# Patient Record
Sex: Female | Born: 1970 | Race: Black or African American | Hispanic: No | Marital: Single | Smoking: Never smoker
Health system: Southern US, Community
[De-identification: ages and names within clinical notes are randomized; demographics above are authoritative.]

## PROBLEM LIST (undated history)

## (undated) DIAGNOSIS — J45909 Unspecified asthma, uncomplicated: Secondary | ICD-10-CM

## (undated) HISTORY — PX: HERNIA REPAIR: SHX51

## (undated) HISTORY — DX: Unspecified asthma, uncomplicated: J45.909

---

## 2017-06-19 ENCOUNTER — Ambulatory Visit
Admission: RE | Admit: 2017-06-19 | Discharge: 2017-06-19 | Disposition: A | Discharge status: Home / Self Care | Payer: No Typology Code available for payment source | Source: Ambulatory Visit | Attending: Internal Medicine | Admitting: Internal Medicine

## 2017-06-19 ENCOUNTER — Other Ambulatory Visit: Payer: Self-pay | Admitting: Internal Medicine

## 2017-06-19 DIAGNOSIS — Z111 Encounter for screening for respiratory tuberculosis: Secondary | ICD-10-CM

## 2018-09-28 ENCOUNTER — Encounter: Payer: Self-pay | Admitting: *Deleted

## 2018-09-28 NOTE — Congregational Nurse Program (Signed)
Patient is from [place] and states she and daughter need counseling due to her ex husband's family exerting emotional  pressure on her to have her daughter return to [place] to have female circumcision performed.   Patient does not have Medicaid or orange card. Consulted with Lattie Hawharlotte Evans, CN who advised CN [facility] @ [phone] to arrange counseling there.  CN [facility] with [facility] and gave her contact information for patient @ [phone].  Thea SilversmithMackenzie stated she will call patient and complete screening questionaire by phone with patient.  CN advised patient that [facility] will call her.  Patient verbalized understanding and stated she felt comfortable speaking with [facility] over the phone. Also advised patient to return to [facility] on 09/1918 @ 2:00 pm in order to enroll for the [employer] with assistance from [place] with [employer].  Patient verbalized understanding.

## 2018-09-29 ENCOUNTER — Encounter: Payer: Self-pay | Admitting: *Deleted

## 2018-09-29 NOTE — Congregational Nurse Program (Signed)
CN first saw patient on 09/28/18.  Patient was  seeking help for her daughter to have a mental health evaluation performed by a psychologist.   Patient stated that her ex-husband's family is pressuring her to bring her 47 year old daughter to [place] for female circumcision.  CN spoke with [facility], nonprofit counseling service to seek services for patient.  [facility] stated they would call patient for intake questionaire.  Patient returned to nurse clinic today (09/29/18) and stated [facility] had not contacted her.  CN called [facility] while patient was in clinic.  After patient's intake, [facility] ascertained that patient's request for psychological evaluation could not be met at [facility].  Dr. Luiz Ochoa stated that [facility] could provide a Comprehensive Clinical Assessment which would be appropriate for the child. Patient rejects suggestions and states her immigration attorney states child needs psychological evaluation.  Patient will follow up with her attorney with information she has obtained. Brett Canales, BSN, RN Congregational Nurse [phone]

## 2018-11-19 LAB — GLUCOSE, POCT (MANUAL RESULT ENTRY): POC Glucose: 106 mg/dl — AB (ref 70–99)

## 2018-12-25 ENCOUNTER — Ambulatory Visit: Payer: Self-pay | Admitting: Internal Medicine

## 2019-01-05 DIAGNOSIS — R7309 Other abnormal glucose: Secondary | ICD-10-CM

## 2019-01-05 LAB — GLUCOSE, POCT (MANUAL RESULT ENTRY): POC Glucose: 65 mg/dl — AB (ref 70–99)

## 2019-01-05 NOTE — Congregational Nurse Program (Signed)
Ms Void came in for blood sugar and blood pressure check. She has been going through tough times with immigration and her stress level are high. She is seeing a mental health professional and has orange card. She will be seeing her PCP soon. Her blood pressure was low to 65. She has not had anything to eat since last night although she has no food insecurities at this time.I have given her soda to drink.I have encouraged her to eat small frequent meals.She will be back next week for blood sugar check. Arman Bogus RN BSN PCCN [phone]

## 2020-01-31 DIAGNOSIS — Z139 Encounter for screening, unspecified: Secondary | ICD-10-CM

## 2020-01-31 NOTE — Congregational Nurse Program (Addendum)
  Dept: [phone]   Congregational Nurse Program Note  Date of Encounter: 01/31/2020  Past Medical History: No past medical history on file.  Encounter Details:  Client came into clinic to get blood pressure and blood sugar. Her BP was 123/78 and blood sugar 101 not fasting. During conversation she was interested in dental care for her family. I gave her [facility] information and made a call to get her on a waiting list for cleaning/x-rays. She also wanted information on food mobile markets when I asked her about fresh food. I gave her 4 different afternoon mobile market dates coming up in the next 2 months. 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 I placed a call to [facility] [phone] to sign her up for a vaccine 02/03/20 at 4[place]45pm [place]. Client expressed appreciation for the information. Orion Crook, RN, CNP [phone]

## 2020-02-01 LAB — GLUCOSE, POCT (MANUAL RESULT ENTRY): POC Glucose: 101 mg/dl — AB (ref 70–99)

## 2020-02-03 ENCOUNTER — Ambulatory Visit: Payer: Self-pay | Attending: Internal Medicine

## 2020-02-03 DIAGNOSIS — Z23 Encounter for immunization: Secondary | ICD-10-CM

## 2020-02-03 NOTE — Progress Notes (Signed)
   Covid-19 Vaccination Clinic  Name:  Sherry Edwards    MRN: [id number] DOB: Aug 28, 1971  02/03/2020  [name] was observed post Covid-19 immunization for 15 minutes without incident. She was provided with Vaccine Information Sheet and instruction to access the V-Safe system.   [name] was instructed to call 911 with any severe reactions post vaccine: Marland Kitchen Difficulty breathing  . Swelling of face and throat  . A fast heartbeat  . A bad rash all over body  . Dizziness and weakness   Immunizations Administered    Name Date Dose VIS Date Route   Pfizer COVID-19 Vaccine 02/03/2020  5:04 PM 0.3 mL 10/22/2019 Intramuscular   Manufacturer: [employer], [employer]   Lot: [id number]   NDC: [address]-5

## 2020-02-28 ENCOUNTER — Ambulatory Visit: Payer: Self-pay | Attending: Internal Medicine

## 2020-02-28 DIAGNOSIS — Z23 Encounter for immunization: Secondary | ICD-10-CM

## 2020-02-28 NOTE — Progress Notes (Signed)
   Covid-19 Vaccination Clinic  Name:  Sherry Edwards    MRN: [id number] DOB: 08-07-1971  02/28/2020  [name] was observed post Covid-19 immunization for 15 minutes without incident. She was provided with Vaccine Information Sheet and instruction to access the V-Safe system.   [name] was instructed to call 911 with any severe reactions post vaccine: Marland Kitchen Difficulty breathing  . Swelling of face and throat  . A fast heartbeat  . A bad rash all over body  . Dizziness and weakness   Immunizations Administered    Name Date Dose VIS Date Route   Pfizer COVID-19 Vaccine 02/28/2020  3:08 PM 0.3 mL 01/05/2019 Intramuscular   Manufacturer: [employer], [employer]   Lot: [id number]   NDC: [address]-9

## 2020-05-17 ENCOUNTER — Telehealth: Payer: Self-pay

## 2020-05-17 NOTE — Telephone Encounter (Signed)
Client requesting information for help with orange card. CNP wil work on getting appointment to help client with this application. Client also needs help getting dental extraction and surgery for her son. CNP will research this for the client. Orion Crook, RN, BSN, CNP [phone]

## 2020-05-24 ENCOUNTER — Other Ambulatory Visit: Payer: Self-pay

## 2020-05-24 ENCOUNTER — Telehealth: Payer: Self-pay

## 2020-05-24 ENCOUNTER — Ambulatory Visit: Payer: Self-pay

## 2020-05-24 NOTE — Telephone Encounter (Signed)
I spoke with client to set appointment with Tia Masker with [facility] to assist her with [facility] Card application/renewal and finding dental care for her son. Appointment set for next Tuesday at 2pm at [facility]. Orion Crook, RN, BSN, CNP [phone]

## 2020-05-25 DIAGNOSIS — Z139 Encounter for screening, unspecified: Secondary | ICD-10-CM

## 2020-05-25 LAB — GLUCOSE, POCT (MANUAL RESULT ENTRY): POC Glucose: 89 mg/dl (ref 70–99)

## 2020-05-25 NOTE — Congregational Nurse Program (Signed)
  Dept: [phone]   Congregational Nurse Program Note  Date of Encounter: 05/25/2020  Past Medical History: No past medical history on file.  Encounter Details:  CNP Questionnaire - 05/25/20 1117      Questionnaire   Patient Status Immigrant    Race African    Location Patient Served At [employer] Not Applicable    Uninsured Uninsured (NEW 1x/quarter)    Food Yes, have food insecurities    Housing/Utilities Yes, have permanent housing    Transportation No transportation needs    Interpersonal Safety Yes, feel physically and emotionally safe where you currently live    Medication No medication insecurities    Medical Provider No    Referrals Primary Care Provider/Clinic;Orange Card/Care Connects    ED Visit Averted Yes    Life-Saving Intervention Made Not Applicable          Client came to client for screening for BP and blood sugar. She does not have a PCP. BP 109/71 and pulse 61. Blood sugar 89 fasting. She complains of Left leg swelling and pain. Upon assessment bot legs appear swollen but client only complains of pain in left leg starting at shin and running down front of leg to foot. She works as a Lawyer. She is seeing a Hotel manager, Raoufou Ousmane with [facility] on Tuesday at 2pm to work on [employer]. CNP called [facility] to ask for an appointment for this client. CNP gave client information on a monthly mobile food market and will supply her with more information next week on other places and times to get fresh food in the area from [employer]. Previously CNP had referred her to [facility] for dental cleaning and services. She was grateful for this referral and has an appointment for her son to see a surgeon for a tooth extraction and surgery. Orion Crook, RN, BSN, CNP [phone]

## 2020-05-30 ENCOUNTER — Telehealth: Payer: Self-pay

## 2020-05-30 NOTE — Telephone Encounter (Signed)
Spoke to client after speaking with Robyne Peers at [facility]. Laural Benes. set up appointment at [facility] to establish care. Communicated with client appointment set for June 05, 2020 at 3:30pm at [facility] [address]  [place], [place].Client will come by [facility] on Tuesday July 20, 2021at 1pm to get referral and directions to appointment. Orion Crook, RN, BSN, CNP [phone]

## 2020-06-05 ENCOUNTER — Encounter (INDEPENDENT_AMBULATORY_CARE_PROVIDER_SITE_OTHER): Payer: Self-pay | Admitting: Primary Care

## 2020-06-05 ENCOUNTER — Ambulatory Visit (INDEPENDENT_AMBULATORY_CARE_PROVIDER_SITE_OTHER): Payer: Self-pay | Admitting: Primary Care

## 2020-06-05 ENCOUNTER — Other Ambulatory Visit: Payer: Self-pay

## 2020-06-05 VITALS — BP 109/66 | HR 81 | Temp 97.6°F | Resp 16 | Ht 60.0 in | Wt 201.0 lb

## 2020-06-05 DIAGNOSIS — R600 Localized edema: Secondary | ICD-10-CM

## 2020-06-05 DIAGNOSIS — Z7689 Persons encountering health services in other specified circumstances: Secondary | ICD-10-CM

## 2020-06-05 DIAGNOSIS — Z6839 Body mass index (BMI) 39.0-39.9, adult: Secondary | ICD-10-CM

## 2020-06-05 DIAGNOSIS — Z23 Encounter for immunization: Secondary | ICD-10-CM

## 2020-06-05 DIAGNOSIS — Z1159 Encounter for screening for other viral diseases: Secondary | ICD-10-CM

## 2020-06-05 DIAGNOSIS — Z114 Encounter for screening for human immunodeficiency virus [HIV]: Secondary | ICD-10-CM

## 2020-06-05 DIAGNOSIS — E6609 Other obesity due to excess calories: Secondary | ICD-10-CM

## 2020-06-05 DIAGNOSIS — M25572 Pain in left ankle and joints of left foot: Secondary | ICD-10-CM

## 2020-06-05 NOTE — Progress Notes (Signed)
Pt c/o bilateral ankle and feet swelling on ad off since last year

## 2020-06-05 NOTE — Progress Notes (Signed)
New Patient Office Visit  Subjective:  Patient ID: Sherry Edwards, female    DOB: 08-28-1971  Age: 49 y.o. MRN: [id number]  CC: No chief complaint on file.   HPI Sherry Edwards is a 49 year old female  presents for establishment of care . She voices bilateral ankle and feet swelling off and on for over the last year   No past medical history on file.  No past surgical history on file.  No family history on file.  Social History   Socioeconomic History  . Marital status: Married    Spouse name: Not on file  . Number of children: Not on file  . Years of education: Not on file  . Highest education level: Not on file  Occupational History  . Not on file  Tobacco Use  . Smoking status: Never Smoker  . Smokeless tobacco: Never Used  Substance and Sexual Activity  . Alcohol use: Not on file  . Drug use: Not on file  . Sexual activity: Not on file  Other Topics Concern  . Not on file  Social History Narrative  . Not on file   Social Determinants of Health   Financial Resource Strain:   . Difficulty of Paying Living Expenses:   Food Insecurity:   . Worried About Charity fundraiser in the Last Year:   . Arboriculturist in the Last Year:   Transportation Needs:   . Film/video editor (Medical):   Marland Kitchen Lack of Transportation (Non-Medical):   Physical Activity:   . Days of Exercise per Week:   . Minutes of Exercise per Session:   Stress:   . Feeling of Stress :   Social Connections:   . Frequency of Communication with Friends and Family:   . Frequency of Social Gatherings with Friends and Family:   . Attends Religious Services:   . Active Member of Clubs or Organizations:   . Attends Archivist Meetings:   Marland Kitchen Marital Status:   Intimate Partner Violence:   . Fear of Current or Ex-Partner:   . Emotionally Abused:   Marland Kitchen Physically Abused:   . Sexually Abused:     ROS Review of Systems  Cardiovascular: Positive for leg swelling.  All other  systems reviewed and are negative.   Objective:   Today's Vitals: BP 109/66   Pulse 81   Temp 97.6 F (36.4 C)   Resp 16   Ht 5' (1.524 m)   Wt 201 lb (91.2 kg)   SpO2 99%   BMI 39.26 kg/m   Physical Exam Vitals reviewed.  Constitutional:      Appearance: She is obese.  HENT:     Head: Normocephalic.     Right Ear: Tympanic membrane normal.     Left Ear: Tympanic membrane normal.  Eyes:     Extraocular Movements: Extraocular movements intact.  Cardiovascular:     Rate and Rhythm: Normal rate and regular rhythm.     Pulses: Normal pulses.     Heart sounds: Normal heart sounds.  Pulmonary:     Effort: Pulmonary effort is normal.  Abdominal:     General: Bowel sounds are normal.  Musculoskeletal:        General: Swelling present. Normal range of motion.     Cervical back: Normal range of motion and neck supple.  Skin:    General: Skin is warm and dry.  Neurological:     Mental Status: She is alert and  oriented to person, place, and time.  Psychiatric:        Mood and Affect: Mood normal.        Behavior: Behavior normal.        Thought Content: Thought content normal.     Assessment & Plan:  Diagnoses and all orders for this visit:  Encounter to establish care Juluis Mire, NP-C will be your  (PCP) she is mastered prepared . Able to diagnosed and treatment also  answer health concern as well as continuing care of varied medical conditions, not limited by cause, organ system, or diagnosis.   Bilateral lower extremity edema Unable to diurese with initial visit blood pressure 109/66 recommend comfort compression hoses and elevation of feet when at home.  Discussed warning sodium in diet -     CBC with Differential -     CMP14+EGFR  Class 2 obesity due to excess calories without serious comorbidity with body mass index (BMI) of 39.0 to 39.9 in adult Obesity is 30-39 indicating an excess in caloric intake or underlining conditions. This may lead to other  co-morbidities. ie type 2 diabetes, hypertension and respiratory complications .lifestyle modifications of diet and exercise may reduce obesity.  -     Lipid Panel  Encounter for screening for HIV Healthcare maintenance and care gaps -     HIV Antibody (routine testing w rflx)  Encounter for hepatitis C screening test for low risk patient  Healthcare maintenance and care gaps -     Hepatitis C Antibody  Acute left ankle pain Denies any falls or potential twisting foot the wrong way.  Range of motion normal.  May use ice or heat for comfort  Need for Tdap vaccination -     Tdap vaccine greater than or equal to 7yo IM    Follow-up: Return if symptoms worsen or fail to improve.   Kerin Perna, NP

## 2020-06-05 NOTE — Patient Instructions (Signed)
DASH Eating Plan DASH stands for "Dietary Approaches to Stop Hypertension." The DASH eating plan is a healthy eating plan that has been shown to reduce high blood pressure (hypertension). It may also reduce your risk for type 2 diabetes, heart disease, and stroke. The DASH eating plan may also help with weight loss. What are tips for following this plan?  General guidelines  Avoid eating more than 2,300 mg (milligrams) of salt (sodium) a day. If you have hypertension, you may need to reduce your sodium intake to 1,500 mg a day.  Limit alcohol intake to no more than 1 drink a day for nonpregnant women and 2 drinks a day for men. One drink equals 12 oz of beer, 5 oz of wine, or 1 oz of hard liquor.  Work with your health care provider to maintain a healthy body weight or to lose weight. Ask what an ideal weight is for you.  Get at least 30 minutes of exercise that causes your heart to beat faster (aerobic exercise) most days of the week. Activities may include walking, swimming, or biking.  Work with your health care provider or diet and nutrition specialist (dietitian) to adjust your eating plan to your individual calorie needs. Reading food labels   Check food labels for the amount of sodium per serving. Choose foods with less than 5 percent of the Daily Value of sodium. Generally, foods with less than 300 mg of sodium per serving fit into this eating plan.  To find whole grains, look for the word "whole" as the first word in the ingredient list. Shopping  Buy products labeled as "low-sodium" or "no salt added."  Buy fresh foods. Avoid canned foods and premade or frozen meals. Cooking  Avoid adding salt when cooking. Use salt-free seasonings or herbs instead of table salt or sea salt. Check with your health care provider or pharmacist before using salt substitutes.  Do not fry foods. Cook foods using healthy methods such as baking, boiling, grilling, and broiling instead.  Cook with  heart-healthy oils, such as olive, canola, soybean, or sunflower oil. Meal planning  Eat a balanced diet that includes: ? 5 or more servings of fruits and vegetables each day. At each meal, try to fill half of your plate with fruits and vegetables. ? Up to 6-8 servings of whole grains each day. ? Less than 6 oz of lean meat, poultry, or fish each day. A 3-oz serving of meat is about the same size as a deck of cards. One egg equals 1 oz. ? 2 servings of low-fat dairy each day. ? A serving of nuts, seeds, or beans 5 times each week. ? Heart-healthy fats. Healthy fats called Omega-3 fatty acids are found in foods such as flaxseeds and coldwater fish, like sardines, salmon, and mackerel.  Limit how much you eat of the following: ? Canned or prepackaged foods. ? Food that is high in trans fat, such as fried foods. ? Food that is high in saturated fat, such as fatty meat. ? Sweets, desserts, sugary drinks, and other foods with added sugar. ? Full-fat dairy products.  Do not salt foods before eating.  Try to eat at least 2 vegetarian meals each week.  Eat more home-cooked food and less restaurant, buffet, and fast food.  When eating at a restaurant, ask that your food be prepared with less salt or no salt, if possible. What foods are recommended? The items listed may not be a complete list. Talk with your dietitian about   what dietary choices are best for you. Grains Whole-grain or whole-wheat bread. Whole-grain or whole-wheat pasta. Brown rice. Oatmeal. Quinoa. Bulgur. Whole-grain and low-sodium cereals. Pita bread. Low-fat, low-sodium crackers. Whole-wheat flour tortillas. Vegetables Fresh or frozen vegetables (raw, steamed, roasted, or grilled). Low-sodium or reduced-sodium tomato and vegetable juice. Low-sodium or reduced-sodium tomato sauce and tomato paste. Low-sodium or reduced-sodium canned vegetables. Fruits All fresh, dried, or frozen fruit. Canned fruit in natural juice (without  added sugar). Meat and other protein foods Skinless chicken or turkey. Ground chicken or turkey. Pork with fat trimmed off. Fish and seafood. Egg whites. Dried beans, peas, or lentils. Unsalted nuts, nut butters, and seeds. Unsalted canned beans. Lean cuts of beef with fat trimmed off. Low-sodium, lean deli meat. Dairy Low-fat (1%) or fat-free (skim) milk. Fat-free, low-fat, or reduced-fat cheeses. Nonfat, low-sodium ricotta or cottage cheese. Low-fat or nonfat yogurt. Low-fat, low-sodium cheese. Fats and oils Soft margarine without trans fats. Vegetable oil. Low-fat, reduced-fat, or light mayonnaise and salad dressings (reduced-sodium). Canola, safflower, olive, soybean, and sunflower oils. Avocado. Seasoning and other foods Herbs. Spices. Seasoning mixes without salt. Unsalted popcorn and pretzels. Fat-free sweets. What foods are not recommended? The items listed may not be a complete list. Talk with your dietitian about what dietary choices are best for you. Grains Baked goods made with fat, such as croissants, muffins, or some breads. Dry pasta or rice meal packs. Vegetables Creamed or fried vegetables. Vegetables in a cheese sauce. Regular canned vegetables (not low-sodium or reduced-sodium). Regular canned tomato sauce and paste (not low-sodium or reduced-sodium). Regular tomato and vegetable juice (not low-sodium or reduced-sodium). Pickles. Olives. Fruits Canned fruit in a light or heavy syrup. Fried fruit. Fruit in cream or butter sauce. Meat and other protein foods Fatty cuts of meat. Ribs. Fried meat. Bacon. Sausage. Bologna and other processed lunch meats. Salami. Fatback. Hotdogs. Bratwurst. Salted nuts and seeds. Canned beans with added salt. Canned or smoked fish. Whole eggs or egg yolks. Chicken or turkey with skin. Dairy Whole or 2% milk, cream, and half-and-half. Whole or full-fat cream cheese. Whole-fat or sweetened yogurt. Full-fat cheese. Nondairy creamers. Whipped toppings.  Processed cheese and cheese spreads. Fats and oils Butter. Stick margarine. Lard. Shortening. Ghee. Bacon fat. Tropical oils, such as coconut, palm kernel, or palm oil. Seasoning and other foods Salted popcorn and pretzels. Onion salt, garlic salt, seasoned salt, table salt, and sea salt. Worcestershire sauce. Tartar sauce. Barbecue sauce. Teriyaki sauce. Soy sauce, including reduced-sodium. Steak sauce. Canned and packaged gravies. Fish sauce. Oyster sauce. Cocktail sauce. Horseradish that you find on the shelf. Ketchup. Mustard. Meat flavorings and tenderizers. Bouillon cubes. Hot sauce and Tabasco sauce. Premade or packaged marinades. Premade or packaged taco seasonings. Relishes. Regular salad dressings. Where to find more information:  National Heart, Lung, and Blood Institute: www.nhlbi.nih.gov  American Heart Association: www.heart.org Summary  The DASH eating plan is a healthy eating plan that has been shown to reduce high blood pressure (hypertension). It may also reduce your risk for type 2 diabetes, heart disease, and stroke.  With the DASH eating plan, you should limit salt (sodium) intake to 2,300 mg a day. If you have hypertension, you may need to reduce your sodium intake to 1,500 mg a day.  When on the DASH eating plan, aim to eat more fresh fruits and vegetables, whole grains, lean proteins, low-fat dairy, and heart-healthy fats.  Work with your health care provider or diet and nutrition specialist (dietitian) to adjust your eating plan to your   individual calorie needs. This information is not intended to replace advice given to you by your health care provider. Make sure you discuss any questions you have with your health care provider. Document Revised: 10/10/2017 Document Reviewed: 10/21/2016 Elsevier Patient Education  2020 Elsevier Inc.  

## 2020-06-06 LAB — LIPID PANEL
Chol/HDL Ratio: 2.9 ratio (ref 0.0–4.4)
Cholesterol, Total: 190 mg/dL (ref 100–199)
HDL: 66 mg/dL (ref >39)
LDL Chol Calc (NIH): 112 mg/dL — ABNORMAL HIGH (ref 0–99)
Triglycerides: 63 mg/dL (ref 0–149)
VLDL Cholesterol Cal: 12 mg/dL (ref 5–40)

## 2020-06-06 LAB — CMP14+EGFR
ALT: 26 IU/L (ref 0–32)
AST: 20 IU/L (ref 0–40)
Albumin/Globulin Ratio: 1.5 (ref 1.2–2.2)
Albumin: 4.2 g/dL (ref 3.8–4.8)
Alkaline Phosphatase: 79 IU/L (ref 48–121)
BUN/Creatinine Ratio: 13 (ref 9–23)
BUN: 13 mg/dL (ref 6–24)
Bilirubin Total: 0.5 mg/dL (ref 0.0–1.2)
CO2: 24 mmol/L (ref 20–29)
Calcium: 8.9 mg/dL (ref 8.7–10.2)
Chloride: 104 mmol/L (ref 96–106)
Creatinine, Ser: 1 mg/dL (ref 0.57–1.00)
GFR calc Af Amer: 78 mL/min/{1.73_m2} (ref >59)
GFR calc non Af Amer: 67 mL/min/{1.73_m2} (ref >59)
Globulin, Total: 2.8 g/dL (ref 1.5–4.5)
Glucose: 100 mg/dL — ABNORMAL HIGH (ref 65–99)
Potassium: 3.8 mmol/L (ref 3.5–5.2)
Sodium: 141 mmol/L (ref 134–144)
Total Protein: 7 g/dL (ref 6.0–8.5)

## 2020-06-06 LAB — CBC WITH DIFFERENTIAL/PLATELET
Basophils Absolute: 0 10*3/uL (ref 0.0–0.2)
Basos: 1 %
EOS (ABSOLUTE): 0.2 10*3/uL (ref 0.0–0.4)
Eos: 4 %
Hematocrit: 42.9 % (ref 34.0–46.6)
Hemoglobin: 13.1 g/dL (ref 11.1–15.9)
Immature Grans (Abs): 0 10*3/uL (ref 0.0–0.1)
Immature Granulocytes: 1 %
Lymphocytes Absolute: 2.5 10*3/uL (ref 0.7–3.1)
Lymphs: 43 %
MCH: 22.4 pg — ABNORMAL LOW (ref 26.6–33.0)
MCHC: 30.5 g/dL — ABNORMAL LOW (ref 31.5–35.7)
MCV: 73 fL — ABNORMAL LOW (ref 79–97)
Monocytes Absolute: 0.4 10*3/uL (ref 0.1–0.9)
Monocytes: 7 %
Neutrophils Absolute: 2.6 10*3/uL (ref 1.4–7.0)
Neutrophils: 44 %
RBC: 5.85 x10E6/uL — ABNORMAL HIGH (ref 3.77–5.28)
RDW: 14.9 % (ref 11.7–15.4)
WBC: 5.7 10*3/uL (ref 3.4–10.8)

## 2020-06-06 LAB — HEPATITIS C ANTIBODY: Hep C Virus Ab: <0.1 s/co ratio (ref 0.0–0.9)

## 2020-06-06 LAB — HIV ANTIBODY (ROUTINE TESTING W REFLEX): HIV Screen 4th Generation wRfx: NONREACTIVE

## 2020-06-13 DIAGNOSIS — R600 Localized edema: Secondary | ICD-10-CM | POA: Insufficient documentation

## 2020-06-13 DIAGNOSIS — E669 Obesity, unspecified: Secondary | ICD-10-CM | POA: Insufficient documentation

## 2020-06-20 ENCOUNTER — Other Ambulatory Visit: Payer: Self-pay | Admitting: Obstetrics and Gynecology

## 2020-06-20 DIAGNOSIS — Z1231 Encounter for screening mammogram for malignant neoplasm of breast: Secondary | ICD-10-CM

## 2020-08-10 ENCOUNTER — Ambulatory Visit: Payer: Self-pay

## 2020-09-12 ENCOUNTER — Other Ambulatory Visit: Payer: Self-pay

## 2020-09-12 ENCOUNTER — Ambulatory Visit
Admission: RE | Admit: 2020-09-12 | Discharge: 2020-09-12 | Disposition: A | Discharge status: Home / Self Care | Payer: No Typology Code available for payment source | Source: Ambulatory Visit | Attending: Obstetrics and Gynecology | Admitting: Obstetrics and Gynecology

## 2020-09-12 ENCOUNTER — Ambulatory Visit: Payer: Self-pay | Admitting: *Deleted

## 2020-09-12 VITALS — BP 106/64 | Temp 97.1°F | Wt 201.1 lb

## 2020-09-12 DIAGNOSIS — Z01419 Encounter for gynecological examination (general) (routine) without abnormal findings: Secondary | ICD-10-CM

## 2020-09-12 DIAGNOSIS — Z1231 Encounter for screening mammogram for malignant neoplasm of breast: Secondary | ICD-10-CM

## 2020-09-12 NOTE — Progress Notes (Signed)
Ms. Mayte Diers is a 49 y.o. No obstetric history on file. female who presents to [facility] clinic today with no complaints.    Pap Smear: Pap smear completed today. Per patient has never had a Pap smear completed. Last Pap smear result is available in Epic.   Physical exam: Breasts Breasts symmetrical. No skin abnormalities bilateral breasts. No nipple retraction bilateral breasts. No nipple discharge bilateral breasts. No lymphadenopathy. No lumps palpated bilateral breasts. No complaints of pain or tenderness on exam.       Pelvic/Bimanual Ext Genitalia No lesions, no swelling and no discharge observed on external genitalia.        Vagina Vagina pink and normal texture. No lesions or discharge observed in vagina.        Cervix Cervix is present. Cervix pink and of normal texture. No discharge observed.    Uterus Uterus is present and palpable. Uterus in normal position and normal size.        Adnexae Bilateral ovaries present and palpable. No tenderness on palpation.         Rectovaginal No rectal exam completed today since patient had no rectal complaints. No skin abnormalities observed on exam.     Smoking History: Patient has never smoked.   Patient Navigation: Patient education provided. Access to services provided for patient through [facility] program.    Breast and Cervical Cancer Risk Assessment: Patient does not have family history of breast cancer, known genetic mutations, or radiation treatment to the chest before age 30. Patient does not have history of cervical dysplasia, immunocompromised, or DES exposure in-utero.  Risk Assessment    Risk Scores      09/12/2020   Last edited by: [name], [name]   5-year risk: 1 %   Lifetime risk: 9 %         A: [facility] exam with pap smear No complaints.  P: Referred patient to the [facility] for a screening mammogram on the mobile unit. Appointment scheduled Tuesday, September 12, 2020 at  1300.  [name], RN 09/12/2020 11:56 AM

## 2020-09-12 NOTE — Patient Instructions (Signed)
Explained breast self awareness with [name]. Pap smear completed today. Let her know [facility] will cover Pap smears and HPV typing every 5 years unless has a history of abnormal Pap smears. Referred patient to the [facility] for a screening mammogram on the mobile unit. Appointment scheduled Tuesday, September 12, 2020 at 1300. Patient aware of appointment and will be there. Let patient know will follow up with her within the next couple weeks with results of her Pap smear by letter or phone. Informed patient that the Breast Center will follow-up with her within the next couple of weeks with results of her mammogram by letter or phone. Sherry Edwards verbalized understanding.  Randell Detter, Kathaleen Maser, RN 11:56 AM

## 2020-09-15 LAB — CYTOLOGY - PAP
Comment: NEGATIVE
Diagnosis: UNDETERMINED — AB
High risk HPV: NEGATIVE

## 2020-09-18 ENCOUNTER — Telehealth: Payer: Self-pay

## 2020-09-18 NOTE — Progress Notes (Signed)
Repeat pap smear in 1 year?   Thank you,  Clois Dupes, [name]

## 2020-09-18 NOTE — Telephone Encounter (Signed)
Patient informed pap results, ASC-US (different cells), HPV-negative, changes consistent with candida (yeast infection), asked if was having symptoms of yeast infection(vaginal itching,discharge). Patient verbalized understanding of results, denies any vaginal itching, discharge, other symptoms of yeast infection). Patient informed to call back if symptoms develop.

## 2020-10-23 ENCOUNTER — Other Ambulatory Visit: Payer: Self-pay

## 2020-10-23 ENCOUNTER — Ambulatory Visit: Payer: Self-pay

## 2020-11-22 ENCOUNTER — Other Ambulatory Visit: Payer: Self-pay

## 2020-11-22 ENCOUNTER — Inpatient Hospital Stay: Payer: Self-pay | Attending: Obstetrics and Gynecology | Admitting: *Deleted

## 2020-11-22 VITALS — BP 114/78 | Ht 62.0 in | Wt 200.8 lb

## 2020-11-22 DIAGNOSIS — Z0000 Encounter for general adult medical examination without abnormal findings: Secondary | ICD-10-CM

## 2020-11-22 NOTE — Progress Notes (Signed)
[facility] initial screening     Clinical Measurement:  Vitals:   11/22/20 0955 11/22/20 1010  BP: 116/76 114/78   Fasting Labs Drawn Today, will review with patient when they result.   Medical History:  Patient states that she does not have high cholesterol, does not have high blood pressure and she does not have diabetes.  Medications:  Patient states that she does not take medication to lower cholesterol, blood pressure or blood sugar. Patient does not take an aspirin a day to help prevent a heart attack or stroke.    Blood pressure, self measurement: Patient states that she does not measure blood pressure from home. She checks her blood pressure N/A. She shares her readings with a health care provider: N/A.   Nutrition: Patient states that on average she eats 1 cups of fruit and 1 cups of vegetables per day. Patient states that she does not eat fish at least 2 times per week. Patient eats less than half servings of whole grains. Patient drinks less than 36 ounces of beverages with added sugar weekly: yes. Patient is currently watching sodium or salt intake: no. In the past 7 days patient has had 0 drinks containing alcohol. On average patient drinks 0 drinks containing alcohol per day.      Physical activity:  Patient states that she gets 0 minutes of moderate and 0 minutes of vigorous physical activity each week.  Smoking status:  Patient states that she has has never smoked .   Quality of life:  Over the past 2 weeks patient states that she had little interest or pleasure in doing things: several days. She has been feeling down, depressed or hopeless:not at all.    Risk reduction and counseling:   Health Coaching: Spoke with patient about the daily recommendation for fruits and vegetables. Spoke about what foods are considered fruits and what foods would be in the vegetable group. Patient currently eats several different types of dish. We talked about different heart healthy fish that  she can try adding into diet such as salmon, tuna, sardines or sea bass (patient currently eats mackerel). We also spoke about what whole grains are and how they are a part of a heart healthy diet. Gave suggestions of brown rice, whole wheat breads, whole wheat pasta, oatmeal and quinoa. Also spoke about the importance of daily exercise. Encouraged patient to try and start exercising for at least 20 minutes a day.   Navigation:  I will notify patient of lab results.  Patient is aware of 2 more health coaching sessions and a follow up.  Time: 25 minutes

## 2020-11-23 LAB — LIPID PANEL
Chol/HDL Ratio: 2.9 ratio (ref 0.0–4.4)
Cholesterol, Total: 169 mg/dL (ref 100–199)
HDL: 59 mg/dL (ref >39)
LDL Chol Calc (NIH): 100 mg/dL — ABNORMAL HIGH (ref 0–99)
Triglycerides: 46 mg/dL (ref 0–149)
VLDL Cholesterol Cal: 10 mg/dL (ref 5–40)

## 2020-11-23 LAB — GLUCOSE, RANDOM: Glucose: 99 mg/dL (ref 65–99)

## 2020-11-23 LAB — HEMOGLOBIN A1C
Est. average glucose Bld gHb Est-mCnc: 117 mg/dL
Hgb A1c MFr Bld: 5.7 % — ABNORMAL HIGH (ref 4.8–5.6)

## 2020-11-29 ENCOUNTER — Telehealth: Payer: Self-pay

## 2020-11-29 NOTE — Telephone Encounter (Signed)
Health coaching 2     Labs- 169 cholesterol, 100 LDL cholesterol, 46 triglycerides, 59 HDL cholesterol, 5.7 hemoglobin A1C, 99 mean plasma glucose. Patient understands and is aware of her lab results.   Goals-   1. Reduce the amount of fried and fatty foods consumed. (Try to grill, bake broil, or sautee foods instead.) 2. Limit the amount of red meat consumed. Substitute for lean protein like chicken or fish. 3. Limit the amount of whole fat dairy products consumed. Substitute for low-fat or reduced-fat options. 4. Reduce the amount of carbs consumed. (Specifically rice and potatoes since those are the carbs that are consumed the most often.) 5. Start walking for 20 minutes daily.   Navigation:  Patient is aware of 1 more health coaching sessions and a follow up. Patient is scheduled for follow-up appointment with Internal Medicine on Wednesday, December 06, 2020 @ 1:15 pm. Patient is registered for the [employer], Move More" Diabetes Prevention Program. First class will be held online on Wednesday, January 03, 2021 @ 8:15 pm.   Time- 22 minutes

## 2020-12-06 ENCOUNTER — Encounter: Payer: Self-pay | Admitting: Internal Medicine

## 2020-12-06 ENCOUNTER — Other Ambulatory Visit: Payer: Self-pay | Admitting: Internal Medicine

## 2020-12-06 ENCOUNTER — Ambulatory Visit (INDEPENDENT_AMBULATORY_CARE_PROVIDER_SITE_OTHER): Payer: Self-pay | Admitting: Internal Medicine

## 2020-12-06 DIAGNOSIS — E785 Hyperlipidemia, unspecified: Secondary | ICD-10-CM

## 2020-12-06 DIAGNOSIS — E669 Obesity, unspecified: Secondary | ICD-10-CM

## 2020-12-06 DIAGNOSIS — R452 Unhappiness: Secondary | ICD-10-CM

## 2020-12-06 DIAGNOSIS — F32A Depression, unspecified: Secondary | ICD-10-CM

## 2020-12-06 DIAGNOSIS — F329 Major depressive disorder, single episode, unspecified: Secondary | ICD-10-CM

## 2020-12-06 DIAGNOSIS — J45909 Unspecified asthma, uncomplicated: Secondary | ICD-10-CM

## 2020-12-06 DIAGNOSIS — R7303 Prediabetes: Secondary | ICD-10-CM

## 2020-12-06 MED ORDER — ESCITALOPRAM OXALATE 10 MG PO TABS: 10.0000 mg | ORAL_TABLET | Freq: Every day | ORAL | 2 refills | Status: DC

## 2020-12-06 MED FILL — ESCITALOPRAM 10 MG TABLET: 10 | 30 days supply | Qty: 30 | Fill #0

## 2020-12-06 NOTE — Patient Instructions (Addendum)
Sherry Edwards,  It was a pleasure seeing you in clinic. Today we discussed:   Depression: I will send in for medication to help with this. Please start taking Escitalopram 10mg  daily. It may take 4-6 weeks to see the full affect of this. If you are having any side effects, please let know.   Pre-diabetes: Please incorporate more fruits and vegetables into your diet and try to get at least 20 minutes of exercise 3x/week.   If you have any questions or concerns, please call our clinic at [phone] between 9am-5pm and after hours call [phone] and ask for the internal medicine resident on call. If you feel you are having a medical emergency please call 911.   Thank you, we look forward to helping you remain healthy!  If you have not already done so, I recommend getting the COVID vaccine and booster at your earliest convenience.  To schedule an appointment for a COVID vaccine or be added to the vaccine wait list: Go to [phone]   OR Go to [email or web address]                  OR Call [phone]                                     OR Call [phone] and select Option 2    [phone].pdf">  DASH Eating Plan DASH stands for Dietary Approaches to Stop Hypertension. The DASH eating plan is a healthy eating plan that has been shown to:  Reduce high blood pressure (hypertension).  Reduce your risk for type 2 diabetes, heart disease, and stroke.  Help with weight loss. What are tips for following this plan? Reading food labels  Check food labels for the amount of salt (sodium) per serving. Choose foods with less than 5 percent of the Daily Value of sodium. Generally, foods with less than 300 milligrams (mg) of sodium per serving fit into this eating plan.  To find whole grains, look for the word "whole" as the first word in the ingredient list. Shopping  Buy products labeled as "low-sodium" or "no salt  added."  Buy fresh foods. Avoid canned foods and pre-made or frozen meals. Cooking  Avoid adding salt when cooking. Use salt-free seasonings or herbs instead of table salt or sea salt. Check with your health care provider or pharmacist before using salt substitutes.  Do not fry foods. Cook foods using healthy methods such as baking, boiling, grilling, roasting, and broiling instead.  Cook with heart-healthy oils, such as olive, canola, avocado, soybean, or sunflower oil. Meal planning  Eat a balanced diet that includes: ? 4 or more servings of fruits and 4 or more servings of vegetables each day. Try to fill one-half of your plate with fruits and vegetables. ? 6-8 servings of whole grains each day. ? Less than 6 oz (170 g) of lean meat, poultry, or fish each day. A 3-oz (85-g) serving of meat is about the same size as a deck of cards. One egg equals 1 oz (28 g). ? 2-3 servings of low-fat dairy each day. One serving is 1 cup (237 mL). ? 1 serving of nuts, seeds, or beans 5 times each week. ? 2-3 servings of heart-healthy fats. Healthy fats called omega-3 fatty acids are found in foods such as walnuts, flaxseeds, fortified milks, and eggs. These fats are also found in cold-water fish, such as sardines, salmon, and  mackerel.  Limit how much you eat of: ? Canned or prepackaged foods. ? Food that is high in trans fat, such as some fried foods. ? Food that is high in saturated fat, such as fatty meat. ? Desserts and other sweets, sugary drinks, and other foods with added sugar. ? Full-fat dairy products.  Do not salt foods before eating.  Do not eat more than 4 egg yolks a week.  Try to eat at least 2 vegetarian meals a week.  Eat more home-cooked food and less restaurant, buffet, and fast food.   Lifestyle  When eating at a restaurant, ask that your food be prepared with less salt or no salt, if possible.  If you drink alcohol: ? Limit how much you use to:  0-1 drink a day for  women who are not pregnant.  0-2 drinks a day for men. ? Be aware of how much alcohol is in your drink. In the [place] one drink equals one 12 oz bottle of beer (355 mL), one 5 oz glass of wine (148 mL), or one 1 oz glass of hard liquor (44 mL). General information  Avoid eating more than 2,300 mg of salt a day. If you have hypertension, you may need to reduce your sodium intake to 1,500 mg a day.  Work with your health care provider to maintain a healthy body weight or to lose weight. Ask what an ideal weight is for you.  Get at least 30 minutes of exercise that causes your heart to beat faster (aerobic exercise) most days of the week. Activities may include walking, swimming, or biking.  Work with your health care provider or dietitian to adjust your eating plan to your individual calorie needs. What foods should I eat? Fruits All fresh, dried, or frozen fruit. Canned fruit in natural juice (without added sugar). Vegetables Fresh or frozen vegetables (raw, steamed, roasted, or grilled). Low-sodium or reduced-sodium tomato and vegetable juice. Low-sodium or reduced-sodium tomato sauce and tomato paste. Low-sodium or reduced-sodium canned vegetables. Grains Whole-grain or whole-wheat bread. Whole-grain or whole-wheat pasta. Brown rice. Orpah Cobb. Bulgur. Whole-grain and low-sodium cereals. Pita bread. Low-fat, low-sodium crackers. Whole-wheat flour tortillas. Meats and other proteins Skinless chicken or [place]. Ground chicken or [place]. Pork with fat trimmed off. Fish and seafood. Egg whites. Dried beans, peas, or lentils. Unsalted nuts, nut butters, and seeds. Unsalted canned beans. Lean cuts of beef with fat trimmed off. Low-sodium, lean precooked or cured meat, such as sausages or meat loaves. Dairy Low-fat (1%) or fat-free (skim) milk. Reduced-fat, low-fat, or fat-free cheeses. Nonfat, low-sodium ricotta or cottage cheese. Low-fat or nonfat yogurt. Low-fat, low-sodium cheese. Fats  and oils Soft margarine without trans fats. Vegetable oil. Reduced-fat, low-fat, or light mayonnaise and salad dressings (reduced-sodium). Canola, safflower, olive, avocado, soybean, and sunflower oils. Avocado. Seasonings and condiments Herbs. Spices. Seasoning mixes without salt. Other foods Unsalted popcorn and pretzels. Fat-free sweets. The items listed above may not be a complete list of foods and beverages you can eat. Contact a dietitian for more information. What foods should I avoid? Fruits Canned fruit in a light or heavy syrup. Fried fruit. Fruit in cream or butter sauce. Vegetables Creamed or fried vegetables. Vegetables in a cheese sauce. Regular canned vegetables (not low-sodium or reduced-sodium). Regular canned tomato sauce and paste (not low-sodium or reduced-sodium). Regular tomato and vegetable juice (not low-sodium or reduced-sodium). Rosita Fire. Olives. Grains Baked goods made with fat, such as croissants, muffins, or some breads. Dry pasta or rice meal  packs. Meats and other proteins Fatty cuts of meat. Ribs. Fried meat. Tomasa Blase. Bologna, salami, and other precooked or cured meats, such as sausages or meat loaves. Fat from the back of a pig (fatback). Bratwurst. Salted nuts and seeds. Canned beans with added salt. Canned or smoked fish. Whole eggs or egg yolks. Chicken or [place] with skin. Dairy Whole or 2% milk, cream, and half-and-half. Whole or full-fat cream cheese. Whole-fat or sweetened yogurt. Full-fat cheese. Nondairy creamers. Whipped toppings. Processed cheese and cheese spreads. Fats and oils Butter. Stick margarine. Lard. Shortening. Ghee. Bacon fat. Tropical oils, such as coconut, palm kernel, or palm oil. Seasonings and condiments Onion salt, garlic salt, seasoned salt, table salt, and sea salt. Worcestershire sauce. Tartar sauce. Barbecue sauce. Teriyaki sauce. Soy sauce, including reduced-sodium. Steak sauce. Canned and packaged gravies. Fish sauce. Oyster sauce.  Cocktail sauce. Store-bought horseradish. Ketchup. Mustard. Meat flavorings and tenderizers. Bouillon cubes. Hot sauces. Pre-made or packaged marinades. Pre-made or packaged taco seasonings. Relishes. Regular salad dressings. Other foods Salted popcorn and pretzels. The items listed above may not be a complete list of foods and beverages you should avoid. Contact a dietitian for more information. Where to find more information  National Heart, Lung, and Blood Institute: PopSteam.is  American Heart Association: www.heart.org  Academy of Nutrition and Dietetics: www.eatright.org  National Kidney Foundation: www.kidney.org Summary  The DASH eating plan is a healthy eating plan that has been shown to reduce high blood pressure (hypertension). It may also reduce your risk for type 2 diabetes, heart disease, and stroke.  When on the DASH eating plan, aim to eat more fresh fruits and vegetables, whole grains, lean proteins, low-fat dairy, and heart-healthy fats.  With the DASH eating plan, you should limit salt (sodium) intake to 2,300 mg a day. If you have hypertension, you may need to reduce your sodium intake to 1,500 mg a day.  Work with your health care provider or dietitian to adjust your eating plan to your individual calorie needs. This information is not intended to replace advice given to you by your health care provider. Make sure you discuss any questions you have with your health care provider. Document Revised: 10/01/2019 Document Reviewed: 10/01/2019 Elsevier Patient Education  2021 [employer].

## 2020-12-06 NOTE — Progress Notes (Signed)
   CC: f/u prediabetes and hyperlipidemia   HPI:  [name] is a 50 y.o. female with PMHx as listed below presenting for eval of prediabetes and to establish care. Please see problem based charting for complete assessment and plan.  Past Medical History:  Diagnosis Date  . Asthma    Review of Systems:  Negative except as stated in HPI.  Medications: Seretide Diskus 1 puff nightly  Multivitamins   Surgical Hx: Abdominal hernia repair 2011  Family Hx: Mother - hypertension, diabetes  Maternal aunts and uncles - hypertension, diabetes Sister - hypertension  Father - hypertension, arthritis, diabetes   Social Hx:  From [place], moved to the [place] in 2016; works as a Lawyer Has 3 children (ages 24, 24, and 7) No smoking, alcohol or illicit drug use  Physical Exam:  Vitals:   12/06/20 1340  BP: (!) 107/55  Pulse: 69  Temp: 98.4 F (36.9 C)  TempSrc: Oral  SpO2: 99%  Weight: 204 lb 4.8 oz (92.7 kg)   Physical Exam  Constitutional: Appears well-developed and well-nourished. No distress.  HENT: Normocephalic and atraumatic, EOMI, conjunctiva normal, moist mucous membranes Cardiovascular: Normal rate, regular rhythm, S1 and S2 present, no murmurs, rubs, gallops.  Distal pulses intact Respiratory: No respiratory distress, no accessory muscle use.  Effort is normal.  Lungs are clear to auscultation bilaterally. GI: Nondistended, soft, nontender to palpation, active bowel sounds Musculoskeletal: Normal bulk and tone.  No peripheral edema noted. Neurological: Is alert and oriented x4, no apparent focal deficits noted. Skin: Warm and dry.  No rash, erythema, lesions noted. Psychiatric: Depressed mood. Behavior is normal. Judgment and thought content normal.    Assessment & Plan:   See Encounters Tab for problem based charting.  Patient discussed with Dr. Mayford Knife

## 2020-12-08 DIAGNOSIS — R4589 Other symptoms and signs involving emotional state: Secondary | ICD-10-CM | POA: Insufficient documentation

## 2020-12-08 DIAGNOSIS — E785 Hyperlipidemia, unspecified: Secondary | ICD-10-CM | POA: Insufficient documentation

## 2020-12-08 DIAGNOSIS — R7303 Prediabetes: Secondary | ICD-10-CM | POA: Insufficient documentation

## 2020-12-08 DIAGNOSIS — J45909 Unspecified asthma, uncomplicated: Secondary | ICD-10-CM | POA: Insufficient documentation

## 2020-12-08 DIAGNOSIS — F329 Major depressive disorder, single episode, unspecified: Secondary | ICD-10-CM | POA: Insufficient documentation

## 2020-12-08 DIAGNOSIS — F32A Depression, unspecified: Secondary | ICD-10-CM | POA: Insufficient documentation

## 2020-12-08 NOTE — Assessment & Plan Note (Addendum)
Patient notes longstanding history of asthma for which she takes 1 puff of Seretide Diskus nightly.  This is the [place] equivalent of Advair Diskus.  Patient does have a rescue inhaler as well but notes that she has not had to use this.  She denies any cough, shortness of breath, chest pain at this time.  Plan Continue current regimen

## 2020-12-08 NOTE — Assessment & Plan Note (Signed)
Patient with elevated BMI of 37 kg/m2 in setting of prediabetes and dyslipidemia.  Advised on lifestyle modification with incorporation of exercise and decreasing carbohydrate intake.   Plan I encouraged lifestyle modification

## 2020-12-08 NOTE — Assessment & Plan Note (Signed)
Patient notes that he often feels lonely and down due to recent failed marriage, no family members to support with upbringing of her kids.  Her children are 14, 11, 7 and mostly dependent on her.  She notes that she is working almost around-the-clock to help make ends meet.  She expresses anhedonia, depressed mood, difficulty sleeping, feelings of loneliness.  PHQ-9 score is 14.  Denies suicidal or homicidal ideation at this time  Plan Start Lexapro 10 mg daily Patient offered referral to [facility], deferred at this time Follow-up in 4 to 6 weeks

## 2020-12-08 NOTE — Assessment & Plan Note (Signed)
Patient with most recent A1c of 5.7 at recent wellness exam for which she was referred to [facility].  Patient does have a significant family history of diabetes and hypertension.  She endorses that she does not get much exercise and eats more fried food.  Discussed with patient that her current A1c qualifies her as prediabetes.  Advised her against eating fried or fatty food and lots of carbs.  Plan Encourage lifestyle modification

## 2020-12-08 NOTE — Assessment & Plan Note (Signed)
During her recent wellness exam, patient noted to have total cholesterol 169, LDL 100, HDL 59, triglycerides 46.  She does also have prediabetes and obesity.  She endorses dietary noncompliance and mostly sedentary lifestyle.  Patient encouraged to attempt to exercise 20 minutes at least 3 times a week and encouraged to decrease carbohydrate intake and fatty food intake and try to increase healthy fats into her diet such as fish for which she is agreeable.

## 2020-12-12 NOTE — Progress Notes (Signed)
Internal Medicine Clinic Attending ° °Case discussed with Dr. Aslam  At the time of the visit.  We reviewed the resident’s history and exam and pertinent patient test results.  I agree with the assessment, diagnosis, and plan of care documented in the resident’s note.  °

## 2021-01-03 ENCOUNTER — Encounter: Payer: Self-pay | Admitting: Student

## 2021-01-16 ENCOUNTER — Ambulatory Visit: Payer: No Typology Code available for payment source

## 2021-08-13 ENCOUNTER — Other Ambulatory Visit: Payer: Self-pay

## 2021-08-13 DIAGNOSIS — Z1231 Encounter for screening mammogram for malignant neoplasm of breast: Secondary | ICD-10-CM

## 2021-09-13 ENCOUNTER — Ambulatory Visit: Payer: No Typology Code available for payment source

## 2021-10-09 ENCOUNTER — Ambulatory Visit: Payer: Self-pay | Admitting: *Deleted

## 2021-10-09 ENCOUNTER — Ambulatory Visit
Admission: RE | Admit: 2021-10-09 | Discharge: 2021-10-09 | Disposition: A | Discharge status: Home / Self Care | Payer: No Typology Code available for payment source | Source: Ambulatory Visit | Attending: Obstetrics and Gynecology | Admitting: Obstetrics and Gynecology

## 2021-10-09 ENCOUNTER — Other Ambulatory Visit: Payer: Self-pay

## 2021-10-09 VITALS — BP 114/72

## 2021-10-09 DIAGNOSIS — Z1211 Encounter for screening for malignant neoplasm of colon: Secondary | ICD-10-CM

## 2021-10-09 DIAGNOSIS — Z01419 Encounter for gynecological examination (general) (routine) without abnormal findings: Secondary | ICD-10-CM

## 2021-10-09 DIAGNOSIS — Z1231 Encounter for screening mammogram for malignant neoplasm of breast: Secondary | ICD-10-CM

## 2021-10-09 NOTE — Progress Notes (Signed)
[name] is a 50 y.o. G6P0 female who presents to [facility] clinic today with no complaints.    Pap Smear: Pap smear completed today. Last Pap smear was 09/12/2020 at [facility] clinic and was abnormal - ASCUS with negative HPV . Per patient her last Pap smear is the only Pap smear she has had completed. Last Pap smear result is available in Epic.   Physical exam: Breasts Breasts symmetrical. No skin abnormalities bilateral breasts. No nipple retraction bilateral breasts. No nipple discharge bilateral breasts. No lymphadenopathy. No lumps palpated bilateral breasts. No complaints of pain or tenderness on exam.  MS DIGITAL SCREENING TOMO BILATERAL  Result Date: 09/14/2020 CLINICAL DATA:  Screening. EXAM: DIGITAL SCREENING BILATERAL MAMMOGRAM WITH TOMO AND CAD COMPARISON:  None. ACR Breast Density Category b: There are scattered areas of fibroglandular density. FINDINGS: There are no findings suspicious for malignancy. Images were processed with CAD. IMPRESSION: No mammographic evidence of malignancy. A result letter of this screening mammogram will be mailed directly to the patient. RECOMMENDATION: Screening mammogram in one year. (Code:SM-B-01Y) BI-RADS CATEGORY  1: Negative. Electronically Signed   By: Hulan Saas M.D.   On: 09/14/2020 14:31         Pelvic/Bimanual Ext Genitalia No lesions, no swelling and no discharge observed on external genitalia.        Vagina Vagina pink and normal texture. No lesions or discharge observed in vagina.        Cervix Cervix is present. Cervix pink and of normal texture. No discharge observed.    Uterus Uterus is present and palpable. Uterus in normal position and normal size.        Adnexae Bilateral ovaries present and palpable. No tenderness on palpation.         Rectovaginal No rectal exam completed today since patient had no rectal complaints. No skin abnormalities observed on exam.     Smoking History: Patient has never smoked.    Patient Navigation: Patient education provided. Access to services provided for patient through [facility] program.   Colorectal Cancer Screening: Per patient has never had colonoscopy completed. FIT Test given to patient to complete. No complaints today.    Breast and Cervical Cancer Risk Assessment: Patient does not have family history of breast cancer, known genetic mutations, or radiation treatment to the chest before age 31. Patient does not have history of cervical dysplasia, immunocompromised, or DES exposure in-utero.  Risk Assessment     Risk Scores       10/09/2021 09/12/2020   Last edited by: [name], [name] [name], [name], [name]   5-year risk: 1 % 1 %   Lifetime risk: 8.9 % 9 %            A: [facility] exam with pap smear No complaints.  P: Referred patient to the [facility] for a screening mammogram on mobile unit. Appointment scheduled Tuesday, October 09, 2021 at 0950.  [name], RN 10/09/2021 10:02 AM

## 2021-10-09 NOTE — Patient Instructions (Signed)
Explained breast self awareness with Mckinzey Crispo. Pap smear completed today. Let her know [facility] will cover Pap smears and HPV typing every 5 years unless has a history of abnormal Pap smears. Referred patient to the [facility] for a screening mammogram on mobile unit. Appointment scheduled Tuesday, October 09, 2021 at 0950. Patient escorted to the mobile unit following [facility] appointment for her screening mammogram. Let patient know will follow up with her within the next couple weeks with results of her Pap smear by phone. Informed patient that the Breast Center will follow-up with her within the next couple of weeks with results of her mammogram by letter or phone. Devaney Harps verbalized understanding.  Kerwin Augustus, Kathaleen Maser, RN 10:02 AM

## 2021-10-12 LAB — CYTOLOGY - PAP
Comment: NEGATIVE
Diagnosis: NEGATIVE
Diagnosis: REACTIVE
High risk HPV: NEGATIVE

## 2021-10-15 ENCOUNTER — Telehealth: Payer: Self-pay

## 2021-10-15 NOTE — Progress Notes (Signed)
Good Morning,    I spoke with Sherry Edwards, and she stated that she is not having any symptoms of yeast infection or BV. Do we need to call in rx or should she wait until symptoms develop?   Thank you,  Clois Dupes, [name]

## 2021-10-15 NOTE — Telephone Encounter (Addendum)
-----   Message from Catalina Antigua, MD sent at 10/15/2021 10:04 AM EST ----- I wait until they are symptomatic to treat  Patient informed Pap/HPV results negative, repeat in 5 years. Also, informed flora (cells) suggestive of Yeast infection and bacterial vaginitis. Patient stated that she is not having any symptoms. Per Dr. Jolayne Panther, patient informed to wait until symptomatic, given number to call back if needed. Patient verbalized understanding.

## 2022-10-01 ENCOUNTER — Encounter: Payer: Self-pay | Admitting: Internal Medicine

## 2022-10-01 ENCOUNTER — Ambulatory Visit (INDEPENDENT_AMBULATORY_CARE_PROVIDER_SITE_OTHER): Payer: PRIVATE HEALTH INSURANCE | Admitting: Internal Medicine

## 2022-10-01 ENCOUNTER — Other Ambulatory Visit: Payer: Self-pay

## 2022-10-01 VITALS — BP 138/72 | HR 82 | Temp 98.8°F | Resp 20 | Ht <= 58 in | Wt 209.7 lb

## 2022-10-01 DIAGNOSIS — J4541 Moderate persistent asthma with (acute) exacerbation: Secondary | ICD-10-CM | POA: Diagnosis not present

## 2022-10-01 DIAGNOSIS — J330 Polyp of nasal cavity: Secondary | ICD-10-CM

## 2022-10-01 DIAGNOSIS — J310 Chronic rhinitis: Secondary | ICD-10-CM

## 2022-10-01 MED ORDER — ALBUTEROL SULFATE HFA 108 (90 BASE) MCG/ACT IN AERS: 2.0000 | INHALATION_SPRAY | Freq: Four times a day (QID) | RESPIRATORY_TRACT | 1 refills | Status: DC | PRN

## 2022-10-01 MED ORDER — FLUTICASONE-SALMETEROL 250-50 MCG/ACT IN AEPB: 1.0000 | INHALATION_SPRAY | Freq: Two times a day (BID) | RESPIRATORY_TRACT | 5 refills | Status: DC

## 2022-10-01 MED ORDER — PREDNISONE 10 MG PO TABS: ORAL_TABLET | ORAL | 0 refills | Status: AC

## 2022-10-01 MED ORDER — FLUTICASONE PROPIONATE 50 MCG/ACT NA SUSP: 2.0000 | Freq: Every day | NASAL | 5 refills | Status: DC

## 2022-10-01 MED ORDER — CETIRIZINE HCL 10 MG PO TABS: 10.0000 mg | ORAL_TABLET | Freq: Every day | ORAL | 5 refills | Status: DC

## 2022-10-01 MED ORDER — ALBUTEROL SULFATE (2.5 MG/3ML) 0.083% IN NEBU: 2.5000 mg | INHALATION_SOLUTION | Freq: Four times a day (QID) | RESPIRATORY_TRACT | 1 refills | Status: DC | PRN

## 2022-10-01 NOTE — Patient Instructions (Addendum)
Asthma: - MDI technique discussed.   - For the flare up, Prednisone 40mg  daily for 3 days, 20mg  daily for 2 days and then 10mg  daily for 2 days.  - Maintenance inhaler: Advair Diskus 250-73mcg 1 puff twice daily. - Rescue inhaler: Albuterol 2 puffs via spacer or 1 vial via nebulizer every 4-6 hours as needed for respiratory symptoms of cough, shortness of breath, or wheezing Asthma control goals:  Full participation in all desired activities (may need albuterol before activity) Albuterol use two times or less a week on average (not counting use with activity) Cough interfering with sleep two times or less a month Oral steroids no more than once a year No hospitalizations   Rhinitis: - Use nasal saline rinses before nose sprays such as with Neilmed Sinus Rinse.  Use distilled water.   - Use Flonase 2 sprays each nostril daily. Aim upward and outward. - Use Zyrtec 10 mg daily.

## 2022-10-01 NOTE — Progress Notes (Signed)
NEW PATIENT  Date of Service/Encounter:  10/01/22  Consult requested by: Merrilyn Puma, MD   Subjective:   Sherry Edwards (DOB: 1971-10-23) is a 51 y.o. female who presents to the clinic on 10/01/2022 with a chief complaint of Asthma, Breathing Problem, Nasal Congestion, and Wheezing .    History obtained from: chart review and patient.   Asthma:  No childhood asthma.  Reports onset around 2006 when she was in [place]. She moved to the [place] in 2016. Reports requiring IV medication and prednisolone at initial diagnosis in [place] and also requiring transfer of care to a [facility]. She did not have insurance when she first moved so was using Seretide that was given to her in [place].  Since moving to the [place], she reports frequent flare ups with weather change and stress.   She also has had a recent flare up for the past week with wheezing and trouble breathing.  She also feels congested with runny nose.  She is using the Seretide once a day for the past week.  She was also given 4 puffs in clinic which did help her breathing somewhat.  She has also had a lot of congestion and drainage too.   Using rescue inhaler none because she did not know when to use the albuterol and instead using Seretide/Advair Diskus.  Limitations to daily activity: none 0 ED visits, 2 UC visits and 2-3 oral steroids in the past year 1 number of lifetime hospitalizations, 0 number of lifetime intubations.   Current regimen:  Maintenance: Seretide (Advair Diskus) 250-69mcg PRN Rescue: Albuterol 2 puffs q4-6 hrs PRN  Rhinitis:  Ongoing for years. Symptoms include: nasal congestion, rhinorrhea, post nasal drainage, and sneezing  Occurs year-round Potential triggers: unsure   She has seen ENT initially 01/2022 and then a follow up 03/2022 for nasal polyps, chronic rhinitis. She was told to do a prednisone taper, budesonide nasal drops, Zyrtec and Singulair.  It is not clear if she took the Zyrtec/Singulair.   She did report some stomach pain with prednisone and stopped it.  It seems that they have not been able to do a full scope due to turbinate swelling.   Past Medical History: Past Medical History:  Diagnosis Date   Asthma    Past Surgical History: Past Surgical History:  Procedure Laterality Date   HERNIA REPAIR      Family History: Family History  Problem Relation Age of Onset   Hypertension Mother    Hypertension Father    Hypertension Sister    Diabetes Maternal Aunt    Hypertension Maternal Aunt    Cancer Maternal Aunt    Breast cancer Other     Medication List:  Allergies as of 10/01/2022   Not on File      Medication List        Accurate as of October 01, 2022  5:13 PM. If you have any questions, ask your nurse or doctor.          STOP taking these medications    escitalopram 10 MG tablet Commonly known as: LEXAPRO Stopped by: Birder Robson, MD   multivitamin capsule Stopped by: Birder Robson, MD       TAKE these medications    Advair Diskus 250-50 MCG/ACT Aepb Generic drug: fluticasone-salmeterol Inhale 1 puff into the lungs in the morning and at bedtime. What changed: Another medication with the same name was added. Make sure you understand how and when to take each. Changed by:  Birder Robson, MD   fluticasone-salmeterol 250-50 MCG/ACT Aepb Commonly known as: Advair Diskus Inhale 1 puff into the lungs in the morning and at bedtime. What changed: You were already taking a medication with the same name, and this prescription was added. Make sure you understand how and when to take each. Changed by: Birder Robson, MD   albuterol 108 (90 Base) MCG/ACT inhaler Commonly known as: VENTOLIN HFA Inhale 2 puffs into the lungs every 6 (six) hours as needed for wheezing or shortness of breath. What changed:  when to take this reasons to take this Changed by: Birder Robson, MD   albuterol (2.5 MG/3ML) 0.083% nebulizer solution Commonly known as:  PROVENTIL Take 3 mLs (2.5 mg total) by nebulization every 6 (six) hours as needed for wheezing or shortness of breath. What changed: You were already taking a medication with the same name, and this prescription was added. Make sure you understand how and when to take each. Changed by: Birder Robson, MD   cetirizine 10 MG tablet Commonly known as: ZyrTEC Allergy Take 1 tablet (10 mg total) by mouth daily. Started by: Birder Robson, MD   fluticasone 50 MCG/ACT nasal spray Commonly known as: FLONASE Place 2 sprays into both nostrils daily. Started by: Birder Robson, MD   predniSONE 10 MG tablet Commonly known as: DELTASONE Take 4 tablets (40 mg total) by mouth daily with breakfast for 3 days, THEN 2 tablets (20 mg total) daily with breakfast for 2 days, THEN 1 tablet (10 mg total) daily with breakfast for 2 days. Start taking on: October 01, 2022 Started by: Birder Robson, MD         REVIEW OF SYSTEMS: Pertinent positives and negatives discussed in HPI.   Objective:   Physical Exam: BP 138/72   Pulse 82   Temp 98.8 F (37.1 C)   Resp 20   Ht 4\' 6"  (1.372 m)   Wt 209 lb 11.2 oz (95.1 kg)   SpO2 98%   BMI 50.56 kg/m  Body mass index is 50.56 kg/m. GEN: alert, well developed HEENT: clear conjunctiva, nose with significant turbinate swelling and rhinorrhea HEART: regular rate and rhythm, no murmur LUNGS: diffuse wheezing bilaterally, unlabored on RA ABDOMEN: soft, non distended  SKIN: no rashes or lesions  Reviewed:  ENT visits in HPI    Assessment:   1. Moderate persistent asthma with acute exacerbation   2. Chronic rhinitis   3. Polyp of nasal cavity     Plan/Recommendations:   Moderate Persistent Asthma with Exacerbation - Needs spirometry at next visit.  Will need to consider a biologic if she continues to have frequent flare ups even after starting maintenance Advair.  - MDI technique discussed.   - For the flare up, Prednisone 40mg  daily for 3 days,  20mg  daily for 2 days and then 10mg  daily for 2 days.  - Maintenance inhaler: Advair Diskus (Seretide) 250-71mcg 1 puff twice daily. - Rescue inhaler: Albuterol 2 puffs via spacer or 1 vial via nebulizer every 4-6 hours as needed for respiratory symptoms of cough, shortness of breath, or wheezing Asthma control goals:  Full participation in all desired activities (may need albuterol before activity) Albuterol use two times or less a week on average (not counting use with activity) Cough interfering with sleep two times or less a month Oral steroids no more than once a year No hospitalizations   Chronic Rhinitis Nasal Polyp - Follow up with ENT for polyps -  Use nasal saline rinses before nose sprays such as with Neilmed Sinus Rinse.  Use distilled water.   - Use Flonase 2 sprays each nostril daily. Aim upward and outward. - Use Zyrtec 10 mg daily.  - Needs aeroallergen testing to identify triggers.     Return if symptoms worsen or fail to improve. Will call us to schedule once she has her new insurance  Alesia Morin, MD [facility]

## 2022-11-18 ENCOUNTER — Other Ambulatory Visit: Payer: Self-pay | Admitting: Physician Assistant

## 2022-11-18 DIAGNOSIS — Z1231 Encounter for screening mammogram for malignant neoplasm of breast: Secondary | ICD-10-CM

## 2022-12-26 DIAGNOSIS — A048 Other specified bacterial intestinal infections: Secondary | ICD-10-CM | POA: Diagnosis not present

## 2022-12-30 DIAGNOSIS — A048 Other specified bacterial intestinal infections: Secondary | ICD-10-CM | POA: Diagnosis not present

## 2023-01-02 ENCOUNTER — Other Ambulatory Visit (HOSPITAL_COMMUNITY): Payer: Self-pay

## 2023-01-02 MED ORDER — AMOXICILLIN 500 MG PO CAPS
1000.0000 mg | ORAL_CAPSULE | Freq: Two times a day (BID) | ORAL | 0 refills | Status: DC
  Filled 2023-01-02: qty 56, 14d supply, fill #0

## 2023-01-06 ENCOUNTER — Ambulatory Visit
Admission: RE | Admit: 2023-01-06 | Discharge: 2023-01-06 | Disposition: A | Discharge status: Home / Self Care | Payer: 59 | Source: Ambulatory Visit

## 2023-01-06 ENCOUNTER — Other Ambulatory Visit (HOSPITAL_COMMUNITY): Payer: Self-pay

## 2023-01-06 DIAGNOSIS — Z1231 Encounter for screening mammogram for malignant neoplasm of breast: Secondary | ICD-10-CM | POA: Diagnosis not present

## 2023-01-06 MED ORDER — CLARITHROMYCIN 500 MG PO TABS
500.0000 mg | ORAL_TABLET | Freq: Two times a day (BID) | ORAL | 0 refills | Status: DC
  Filled 2023-01-06: qty 28, 14d supply, fill #0

## 2023-01-23 ENCOUNTER — Encounter: Payer: Self-pay | Admitting: Family

## 2023-01-23 ENCOUNTER — Other Ambulatory Visit (HOSPITAL_COMMUNITY): Payer: Self-pay

## 2023-01-23 ENCOUNTER — Other Ambulatory Visit: Payer: Self-pay

## 2023-01-23 ENCOUNTER — Ambulatory Visit (INDEPENDENT_AMBULATORY_CARE_PROVIDER_SITE_OTHER): Payer: 59 | Admitting: Family

## 2023-01-23 VITALS — BP 140/98 | HR 77 | Temp 98.3°F | Ht 62.0 in | Wt 206.2 lb

## 2023-01-23 DIAGNOSIS — J310 Chronic rhinitis: Secondary | ICD-10-CM | POA: Diagnosis not present

## 2023-01-23 DIAGNOSIS — J330 Polyp of nasal cavity: Secondary | ICD-10-CM

## 2023-01-23 DIAGNOSIS — J4541 Moderate persistent asthma with (acute) exacerbation: Secondary | ICD-10-CM

## 2023-01-23 MED ORDER — FLUTICASONE-SALMETEROL 250-50 MCG/ACT IN AEPB
1.0000 | INHALATION_SPRAY | Freq: Two times a day (BID) | RESPIRATORY_TRACT | 5 refills | Status: DC
  Filled 2023-01-23: qty 60, 30d supply, fill #0
  Filled 2023-04-21: qty 60, 30d supply, fill #1

## 2023-01-23 MED ORDER — CETIRIZINE HCL 10 MG PO TABS
10.0000 mg | ORAL_TABLET | Freq: Every day | ORAL | 5 refills | Status: DC
  Filled 2023-01-23: qty 30, 30d supply, fill #0

## 2023-01-23 MED ORDER — PREDNISONE 10 MG PO TABS
ORAL_TABLET | ORAL | 0 refills | Status: AC
  Filled 2023-01-23: qty 16, 7d supply, fill #0

## 2023-01-23 NOTE — Patient Instructions (Addendum)
Asthma:with acute exacerbation - MDI technique discussed.   - For the flare up, Prednisone 10 mg taking 2 tablets twice a day for 3 days, then 2 tablets  daily for 2 days and then '10mg'$  daily for 2 days.  2 tablets given in office today) - Maintenance inhaler: Advair Diskus 250-90mg 1 puff twice daily. - Rescue inhaler: Albuterol 2 puffs via spacer or 1 vial via nebulizer every 4-6 hours as needed for respiratory symptoms of cough, shortness of breath, or wheezing Asthma control goals:  Full participation in all desired activities (may need albuterol before activity) Albuterol use two times or less a week on average (not counting use with activity) Cough interfering with sleep two times or less a month Oral steroids no more than once a year No hospitalizations   Rhinitis: - Use nasal saline rinses before nose sprays such as with Neilmed Sinus Rinse.  Use distilled water.   - Use Flonase 2 sprays each nostril daily. Aim upward and outward. Make sure to use Flonase OR Nasacort not both - Use Zyrtec 10 mg daily.  -consider skin testing to environmental allergies in the future. You will need to be off all antihistamines (Zyrtec) for 3 days prior - Discussed that I do not feel that she needs an antibiotic at this time  Your blood pressure is elevated today while in the office. Please schedule an appointment to discuss with your primary care physician  Schedule a follow up appointment in 4 weeks or sooner if needed

## 2023-01-23 NOTE — Progress Notes (Signed)
[place] [id number] Dept: [phone]  FOLLOW UP NOTE  Patient ID: Sherry Edwards, female    DOB: [birth year]  Age: 52 y.o. MRN: [id number] Date of Office Visit: 01/23/2023  Assessment  Chief Complaint: Cough (Pt c/o of headache and body ache due to cough x 4 days. Took Covid test 01/22/23 negative results.), Breathing Problem, Wheezing, and Nasal Congestion  HPI Sherry Edwards is a 52 year old female who presents today for an acute visit of cough, shortness of breath with exertion, wheezing, and tightness in chest.  She was last seen on October 01, 2022 by Dr. Posey Pronto for moderate persistent asthma with acute exacerbation, chronic rhinitis, and polyp of nasal cavity.  She reports since her last office visit she has been treated for H. pylori with Biaxin and amoxicillin.  She finished her antibiotics this Tuesday.  Asthma: She is currently taking Advair 250/50 1 puff twice a day and has been using her albuterol 2 puffs twice a day since Sunday evening.  She is also started taking Mucinex.  She reports a cough that started out dry and is now productive with white/cream sputum.  She has body aches because of the cough and a headache because of the cough.  She also reports tightness in her chest, shortness of breath with exertion and wheezing.  She denies fever, chills, and nocturnal awakenings due to breathing problems.  She did a rapid COVID test at work yesterday and reports that it was negative.  She is not certain if her albuterol is helping.  She denies any recent surgeries or history of blood clots.  She reports that when she was [place]retide 50[place] her asthma was under better control.  Since her last office visit she has not required any systemic steroids or made any trips to the emergency room or urgent care due to breathing problems.  When she used prednisone in November for asthma it did help with her symptoms.  Chronic rhinitis: She is currently  taking Flonase and Nasacort nasal spray since being sick.  Prior to that she was only using one. She is also taking Zyrtec 10 mg daily. She has seen ENT in the past. She reports constant nasal congestion and denies rhinorrhea and post  nasal drip.She has not been treated for any sinus infections since we last saw her. She wonders if she needs an antibiotic.    Drug Allergies:  Not on File  Review of Systems: Review of Systems  Constitutional:  Negative for chills and fever.       Reports body aches due to coughing  HENT:         Reports that she always has nasal congestion. Denies rhinorrhea and post nasal drip  Eyes:        Denies itchy watery eyes  Respiratory:  Positive for cough, shortness of breath and wheezing.        Reports cough that is now productive with white/cream sputum. Also reports shortness of breath with exertion, wheezing and tightness in chest, denies nocturnal awakenings due to breathing problems  Cardiovascular:  Negative for chest pain and palpitations.  Gastrointestinal:        Denies heartburn or reflux symptoms  Genitourinary:  Negative for frequency.  Skin:  Negative for itching and rash.  Neurological:  Positive for headaches.       Reports headaches only when she coughs     Physical Exam: BP (!) 140/100   Pulse 77  Temp 98.3 F (36.8 C)   Ht '5\' 2"'$  (1.575 m)   Wt 206 lb 3.2 oz (93.5 kg)   SpO2 97%   BMI 37.71 kg/m    Physical Exam Constitutional:      Appearance: Normal appearance.  HENT:     Head: Normocephalic and atraumatic.     Comments: Pharynx normal. Eyes normal. Ears normal. Nose:bilateral lower turbinates moderately edematous and pale with clear drainage noted. Right turbinate greater than left turbinate    Right Ear: Tympanic membrane, ear canal and external ear normal.     Left Ear: Tympanic membrane, ear canal and external ear normal.     Mouth/Throat:     Mouth: Mucous membranes are moist.     Pharynx: Oropharynx is clear.   Eyes:     Conjunctiva/sclera: Conjunctivae normal.  Cardiovascular:     Rate and Rhythm: Regular rhythm.     Heart sounds: Normal heart sounds.  Pulmonary:     Effort: Pulmonary effort is normal.     Breath sounds: Normal breath sounds.     Comments: Lungs  diminished but clear throughout. Lungs sound clearer after albuterol treatment Musculoskeletal:     Cervical back: Neck supple.  Skin:    General: Skin is warm.  Neurological:     Mental Status: She is alert and oriented to person, place, and time.  Psychiatric:        Mood and Affect: Mood normal.        Behavior: Behavior normal.        Thought Content: Thought content normal.        Judgment: Judgment normal.     Diagnostics:  FVC 1.96 L( 71%), FEV1 1.41 L (63%). Spirometry indicates possible restriction. Post bronchodilator response shows FVC 2.29 L, FEV1 1.68 (75%). There is a 19% change in FEV1. She reports that her breathing feels better after albuterol via nebulizer treatment  Assessment and Plan: 1. Moderate persistent asthma with acute exacerbation   2. Chronic rhinitis   3. Polyp of nasal cavity     Meds ordered this encounter  Medications   cetirizine (ZYRTEC ALLERGY) 10 MG tablet    Sig: Take 1 tablet (10 mg total) by mouth daily.    Dispense:  30 tablet    Refill:  5   fluticasone-salmeterol (ADVAIR DISKUS) 250-50 MCG/ACT AEPB    Sig: Inhale 1 puff into the lungs in the morning and at bedtime.    Dispense:  60 each    Refill:  5   predniSONE (DELTASONE) 10 MG tablet    Sig: Take 2 tablets (20 mg total) by mouth tonight, THEN 2 tablets (20 mg total) 2 (two) times daily for 2 days, THEN 2 tablets (20 mg total) daily for 2 days, THEN 1 tablet (10 mg total) daily for 2 days.    Dispense:  16 tablet    Refill:  0    Patient Instructions  Asthma:with acute exacerbation - MDI technique discussed.   - For the flare up, Prednisone 10 mg taking 2 tablets twice a day for 3 days, then 2 tablets  daily for 2  days and then '10mg'$  daily for 2 days.  2 tablets given in office today) - Maintenance inhaler: Advair Diskus 250-50mg 1 puff twice daily. - Rescue inhaler: Albuterol 2 puffs via spacer or 1 vial via nebulizer every 4-6 hours as needed for respiratory symptoms of cough, shortness of breath, or wheezing Asthma control goals:  Full participation in all desired activities (  may need albuterol before activity) Albuterol use two times or less a week on average (not counting use with activity) Cough interfering with sleep two times or less a month Oral steroids no more than once a year No hospitalizations   Rhinitis: - Use nasal saline rinses before nose sprays such as with Neilmed Sinus Rinse.  Use distilled water.   - Use Flonase 2 sprays each nostril daily. Aim upward and outward. Make sure to use Flonase OR Nasacort not both - Use Zyrtec 10 mg daily.  -consider skin testing to environmental allergies in the future. You will need to be off all antihistamines (Zyrtec) for 3 days prior - Discussed that I do not feel that she needs an antibiotic at this time  Your blood pressure is elevated today while in the office. Please schedule an appointment to discuss with your primary care physician  Schedule a follow up appointment in 4 weeks or sooner if needed  Return in about 4 weeks (around 02/20/2023), or if symptoms worsen or fail to improve.    Thank you for the opportunity to care for this patient.  Please do not hesitate to contact me with questions.  Althea Charon, FNP [facility]

## 2023-01-24 ENCOUNTER — Ambulatory Visit: Payer: Self-pay | Admitting: Internal Medicine

## 2023-01-29 ENCOUNTER — Other Ambulatory Visit: Payer: Self-pay | Admitting: Internal Medicine

## 2023-01-29 ENCOUNTER — Other Ambulatory Visit (HOSPITAL_COMMUNITY): Payer: Self-pay

## 2023-01-29 MED ORDER — DOXYCYCLINE MONOHYDRATE 100 MG PO TABS
100.0000 mg | ORAL_TABLET | Freq: Two times a day (BID) | ORAL | 0 refills | Status: DC
  Filled 2023-01-29: qty 14, 7d supply, fill #0

## 2023-01-29 MED ORDER — AMOXICILLIN-POT CLAVULANATE 875-125 MG PO TABS
1.0000 | ORAL_TABLET | Freq: Two times a day (BID) | ORAL | 0 refills | Status: AC
  Filled 2023-01-29: qty 20, 10d supply, fill #0

## 2023-01-29 NOTE — Progress Notes (Signed)
Patient works as Technical brewer in clinic. Heard with wet-sounding cough present for 15 days without improvement. Abdominal pain due to coughing. Recently finished triple therapy for H. Pylori with amox and clarithromycin.  Plan: Augmentin 875 mg twice daily for 10 days. Take with probiotics.

## 2023-02-04 ENCOUNTER — Ambulatory Visit: Payer: Self-pay | Admitting: Internal Medicine

## 2023-02-17 ENCOUNTER — Other Ambulatory Visit (HOSPITAL_COMMUNITY): Payer: Self-pay

## 2023-02-25 ENCOUNTER — Ambulatory Visit: Payer: 59 | Admitting: Internal Medicine

## 2023-02-27 ENCOUNTER — Other Ambulatory Visit (HOSPITAL_COMMUNITY): Payer: Self-pay

## 2023-02-27 ENCOUNTER — Other Ambulatory Visit: Payer: Self-pay | Admitting: *Deleted

## 2023-02-27 MED ORDER — CETIRIZINE HCL 10 MG PO TABS
10.0000 mg | ORAL_TABLET | Freq: Every day | ORAL | 5 refills | Status: DC
  Filled 2023-02-27 – 2023-04-21 (×2): qty 30, 30d supply, fill #0
  Filled 2023-05-21: qty 30, 30d supply, fill #1
  Filled 2023-10-13: qty 30, 30d supply, fill #2

## 2023-02-27 MED ORDER — FLUTICASONE PROPIONATE 50 MCG/ACT NA SUSP
2.0000 | Freq: Every day | NASAL | 5 refills | Status: DC
  Filled 2023-02-27: qty 16, 30d supply, fill #0
  Filled 2023-04-21: qty 16, 30d supply, fill #1

## 2023-02-28 ENCOUNTER — Other Ambulatory Visit (HOSPITAL_COMMUNITY): Payer: Self-pay

## 2023-03-05 ENCOUNTER — Other Ambulatory Visit: Payer: Self-pay | Admitting: Internal Medicine

## 2023-04-21 ENCOUNTER — Other Ambulatory Visit: Payer: Self-pay

## 2023-04-21 ENCOUNTER — Other Ambulatory Visit (HOSPITAL_COMMUNITY): Payer: Self-pay

## 2023-04-22 ENCOUNTER — Telehealth: Payer: Self-pay | Admitting: Allergy & Immunology

## 2023-04-22 ENCOUNTER — Other Ambulatory Visit (HOSPITAL_COMMUNITY): Payer: Self-pay

## 2023-04-22 DIAGNOSIS — J4541 Moderate persistent asthma with (acute) exacerbation: Secondary | ICD-10-CM

## 2023-04-22 NOTE — Telephone Encounter (Unsigned)
Patient came in with SOB and chest pain. This improved with albuterol and Tylenol. She has been afebrile without a cough. She has never had a PE.   Cleda Daub done and this looked normal.   Malachi Bonds, MD [facility]

## 2023-04-23 ENCOUNTER — Other Ambulatory Visit: Payer: Self-pay | Admitting: Internal Medicine

## 2023-04-23 ENCOUNTER — Other Ambulatory Visit (HOSPITAL_COMMUNITY): Payer: Self-pay

## 2023-04-23 MED ORDER — AIRSUPRA 90-80 MCG/ACT IN AERO
2.0000 | INHALATION_SPRAY | RESPIRATORY_TRACT | 5 refills | Status: DC | PRN
  Filled 2023-04-23: qty 10.7, 16d supply, fill #0
  Filled 2023-06-16: qty 10.7, 16d supply, fill #1
  Filled 2023-08-22: qty 10.7, 16d supply, fill #2

## 2023-04-23 NOTE — Addendum Note (Signed)
Addended by: Orson Aloe on: 04/23/2023 04:17 PM   Modules accepted: Orders

## 2023-04-23 NOTE — Telephone Encounter (Signed)
Sherry Edwards has been scanned into patient's chart for future evaluation.

## 2023-04-23 NOTE — Progress Notes (Signed)
Patient having shortness of breath, relieved with albuterol and ibuprofen. She has not had follow-up with Dr. Allena Katz.   Discussed getting her scheduled for follow-up and switching her rescue to AirSupra. Sample provided along with copay card.

## 2023-04-25 ENCOUNTER — Ambulatory Visit (INDEPENDENT_AMBULATORY_CARE_PROVIDER_SITE_OTHER): Payer: 59 | Admitting: Internal Medicine

## 2023-04-25 ENCOUNTER — Other Ambulatory Visit (HOSPITAL_COMMUNITY): Payer: Self-pay

## 2023-04-25 ENCOUNTER — Encounter: Payer: Self-pay | Admitting: Internal Medicine

## 2023-04-25 ENCOUNTER — Other Ambulatory Visit: Payer: Self-pay

## 2023-04-25 VITALS — BP 116/82 | HR 62 | Temp 97.8°F | Resp 16 | Ht 61.0 in | Wt 206.6 lb

## 2023-04-25 DIAGNOSIS — J302 Other seasonal allergic rhinitis: Secondary | ICD-10-CM | POA: Diagnosis not present

## 2023-04-25 DIAGNOSIS — J3089 Other allergic rhinitis: Secondary | ICD-10-CM

## 2023-04-25 DIAGNOSIS — J45998 Other asthma: Secondary | ICD-10-CM | POA: Diagnosis not present

## 2023-04-25 DIAGNOSIS — J4541 Moderate persistent asthma with (acute) exacerbation: Secondary | ICD-10-CM | POA: Diagnosis not present

## 2023-04-25 MED ORDER — AZELASTINE HCL 0.1 % NA SOLN
1.0000 | Freq: Two times a day (BID) | NASAL | 5 refills | Status: DC | PRN
  Filled 2023-04-25: qty 30, 50d supply, fill #0

## 2023-04-25 MED ORDER — BREZTRI AEROSPHERE 160-9-4.8 MCG/ACT IN AERO
2.0000 | INHALATION_SPRAY | Freq: Two times a day (BID) | RESPIRATORY_TRACT | 5 refills | Status: DC
  Filled 2023-04-25: qty 10.7, 30d supply, fill #0
  Filled 2023-06-16: qty 10.7, 30d supply, fill #1
  Filled 2023-07-17: qty 10.7, 30d supply, fill #2
  Filled 2023-08-22: qty 10.7, 30d supply, fill #3

## 2023-04-25 MED ORDER — FLUTICASONE PROPIONATE 50 MCG/ACT NA SUSP
2.0000 | Freq: Every day | NASAL | 5 refills | Status: DC
  Filled 2023-04-25 – 2023-08-22 (×2): qty 16, 30d supply, fill #0
  Filled 2023-10-13: qty 16, 30d supply, fill #1

## 2023-04-25 MED ORDER — LEVOCETIRIZINE DIHYDROCHLORIDE 5 MG PO TABS
5.0000 mg | ORAL_TABLET | Freq: Every evening | ORAL | 5 refills | Status: DC
  Filled 2023-04-25: qty 30, 30d supply, fill #0
  Filled 2023-08-22: qty 30, 30d supply, fill #1

## 2023-04-25 NOTE — Patient Instructions (Addendum)
Nikyah Dhaliwal Return in about 6 weeks (around 06/06/2023).   Moderate Persistent Asthma - Mild exacerbation with wheezing on exam, normal spirometry.  Discussed we will start Breztri; if no improvement in few days, let me know and we will need to do another course of oral prednisone.   - Maintenance inhaler: stop Advair Diskus.  Start Breztri 2 puffs twice daily with spacer.   - Rescue inhaler: Airsupra 2 puffs as needed for respiratory symptoms of cough, shortness of breath, or wheezing. Maximum dose 12 puffs in a day. Asthma control goals:  Full participation in all desired activities (may need albuterol before activity) Albuterol use two times or less a week on average (not counting use with activity) Cough interfering with sleep two times or less a month Oral steroids no more than once a year No hospitalizations    Allergic Rhinitis: - Positive skin test 04/2023: trees, grasses, weeds, mold - Avoidance measures discussed. - Use nasal saline rinses before nose sprays such as with Neilmed Sinus Rinse.  Use distilled water.   - Use Flonase 2 sprays each nostril daily. Aim upward and outward. - Use Azelastine 1-2 sprays each nostril twice daily. Aim upward and outward. - Use Zyrtec 10 mg daily or Xyzal 5mg  daily or Allegra 180mg  daily - Consider allergy shots as long term control of your symptoms by teaching your immune system to be more tolerant of your allergy triggers

## 2023-04-25 NOTE — Progress Notes (Addendum)
FOLLOW UP Date of Service/Encounter:  04/25/23   Subjective:  Sherry Edwards (DOB: 08-17-1971) is a 52 y.o. female who [facility] for follow up for moderate persistent asthma and rhinitis.    History obtained from: chart review and patient. Last visit was on 01/23/2023 with Nehemiah Settle for asthma exacerbation, started on prednisone.  On Advair Diskus.    Asthma/Allergies: Reports having trouble with asthma frequently.  She has been having shortness of breath and wheezing frequently.  Reports the past 1 year, she has had a lot of breathing issues. Frequently needing albuterol so one of our providers did try to switch her to rescueFedEx which she has used yesterday and did notice improvement.  Taking Advair Diskus 1 puff BID and reports compliance; she has been on this since 2008. Also reports some of her shortness of breath is coming from nasal symptoms; has trouble with congestion, drainage, runny nose.  Using Flonase PRN.  Not using any anti histamines. Previously saw ENT who was concerned for nasal polyposis.  Never had any imaging.   Past Medical History: Past Medical History:  Diagnosis Date   Asthma     Objective:  BP 116/82 (BP Location: Right Arm, Patient Position: Sitting, Cuff Size: Large)   Pulse 62   Temp 97.8 F (36.6 C) (Temporal)   Resp 16   Ht 5\' 1"  (1.549 m)   Wt 206 lb 9.6 oz (93.7 kg)   SpO2 100%   BMI 39.04 kg/m  Body mass index is 39.04 kg/m. Physical Exam: GEN: alert, well developed HEENT: clear conjunctiva, nose with moderate inferior turbinate hypertrophy, boggy nasal mucosa, + clear rhinorrhea, + cobblestoning HEART: regular rate and rhythm, no murmur LUNGS: + wheezing, no coughing, unlabored respiration SKIN: no rashes or lesions  Spirometry:  Tracings reviewed. Her effort: Good reproducible efforts. FVC: 2.31L FEV1: 1.78L, 80% predicted FEV1/FVC ratio: 77% Interpretation: Spirometry  consistent with normal pattern.  Please see scanned spirometry results for details.  Skin Testing:  Skin prick testing was placed, which includes aeroallergens/foods, histamine control, and saline control.  Verbal consent was obtained prior to placing test.  Patient tolerated procedure well.  Allergy testing results were read and interpreted by myself, documented by clinical staff. Adequate positive and negative control.  Positive results to:  Results discussed with patient/family.  Airborne Adult Perc - 04/25/23 1300     Time Antigen Placed 1300    Allergen Manufacturer Waynette Buttery    Location Back    Number of Test 55    Panel 1 Select    2. Control-Histamine 3+    3. Bahia Negative    4. French Southern Territories Negative    5. Johnson Negative    6. [place] Blue 2+    7. Meadow Fescue 3+    8. Perennial Rye 3+    9. Timothy 3+    10. Ragweed Mix 3+    11. Cocklebur 2+    12. Plantain,  English Negative    13. Baccharis 2+    14. Dog Fennel 2+    15. Russian Thistle Negative    16. Lamb's Quarters Negative    17. Sheep Sorrell Negative    18. Rough Pigweed 2+    19. Marsh Elder, Rough Negative    20. Mugwort, Common 2+    21. Box, Elder Negative    22. Cedar, red 2+    23. Sweet Gum 2+    24. Pecan Pollen Negative  25. Pine Mix 2+    26. Walnut, Black Pollen 2+    27. Red Mulberry 2+    28. Ash Mix 3+    29. Birch Mix 3+    30. Beech American 3+    31. Cottonwood, Guinea-Bissau Negative    32. Hickory, White 3+    33. Maple Mix 3+    34. Oak, [place] Mix Negative    35. Sycamore Eastern Negative    36. Alternaria Alternata 2+    37. Cladosporium Herbarum 2+    38. Aspergillus Mix Negative    39. Penicillium Mix 3+    40. Bipolaris Sorokiniana (Helminthosporium) 3+    41. Drechslera Spicifera (Curvularia) Negative    42. Mucor Plumbeus Negative    43. Fusarium Moniliforme Negative    44. Aureobasidium Pullulans (pullulara) Negative    45. Rhizopus Oryzae Negative    46. Botrytis  Cinera Negative    47. Epicoccum Nigrum Negative    48. Phoma Betae Negative    49. Dust Mite Mix Negative    50. Cat Hair 10,000 BAU/ml Negative    51.  Dog Epithelia Negative    52. Mixed Feathers Negative    53. Horse Epithelia Negative    54. Cockroach, German Negative    55. Tobacco Leaf Negative              Assessment:   1. Moderate persistent asthma with acute exacerbation   2. Seasonal and perennial allergic rhinitis     Plan/Recommendations:  Moderate Persistent Asthma - Mild exacerbation with wheezing on exam but interestingly normal spirometry.  Discussed we will start Breztri; if no improvement in few days, let me know and we will need to do another course of oral prednisone.   - Maintenance inhaler: stop Advair Diskus.  Start Breztri 2 puffs twice daily with spacer.   - Rescue inhaler: Airsupra 2 puffs as needed for respiratory symptoms of cough, shortness of breath, or wheezing. Maximum dose 12 puffs in a day. Asthma control goals:  Full participation in all desired activities (may need albuterol before activity) Albuterol use two times or less a week on average (not counting use with activity) Cough interfering with sleep two times or less a month Oral steroids no more than once a year No hospitalizations    Allergic Rhinitis: - Due to turbinate hypertrophy, seasonal symptoms and unresponsive to OTC meds, performed skin testing to identify aeroallergen triggers.   - Positive skin test 04/2023: trees, grasses, weeds, mold - Avoidance measures discussed. - Use nasal saline rinses before nose sprays such as with Neilmed Sinus Rinse.  Use distilled water.   - Use Flonase 2 sprays each nostril daily. Aim upward and outward. - Use Azelastine 1-2 sprays each nostril twice daily. Aim upward and outward. - Use Zyrtec 10 mg daily or Xyzal 5mg  daily or Allegra 180mg  daily - Consider allergy shots as long term control of your symptoms by teaching your immune system to  be more tolerant of your allergy triggers - Will consider sinus CT if symptoms persist as I do think uncontrolled upper airway is contributing to her symptoms.  Previously saw ENT with some concern for nasal polyposis but sinus CT was never performed.      Return in about 6 weeks (around 06/06/2023).  Alesia Morin, MD [facility]

## 2023-04-30 ENCOUNTER — Other Ambulatory Visit (HOSPITAL_COMMUNITY): Payer: Self-pay

## 2023-05-21 ENCOUNTER — Other Ambulatory Visit (HOSPITAL_COMMUNITY): Payer: Self-pay

## 2023-05-28 ENCOUNTER — Other Ambulatory Visit: Payer: Self-pay

## 2023-05-28 ENCOUNTER — Encounter: Payer: Self-pay | Admitting: Student

## 2023-05-28 ENCOUNTER — Ambulatory Visit (INDEPENDENT_AMBULATORY_CARE_PROVIDER_SITE_OTHER): Payer: 59 | Admitting: Student

## 2023-05-28 VITALS — BP 126/68 | HR 70 | Temp 98.0°F | Ht 61.0 in | Wt 211.5 lb

## 2023-05-28 DIAGNOSIS — E669 Obesity, unspecified: Secondary | ICD-10-CM

## 2023-05-28 DIAGNOSIS — R7303 Prediabetes: Secondary | ICD-10-CM | POA: Diagnosis not present

## 2023-05-28 DIAGNOSIS — Z6830 Body mass index (BMI) 30.0-30.9, adult: Secondary | ICD-10-CM

## 2023-05-28 DIAGNOSIS — E785 Hyperlipidemia, unspecified: Secondary | ICD-10-CM | POA: Diagnosis not present

## 2023-05-28 NOTE — Progress Notes (Signed)
CC: Annual Wellness visit   HPI:  [name] is a 52 y.o. female living with a history stated below and presents today for annual wellness visit. Please see problem based assessment and plan for additional details.  Past Medical History:  Diagnosis Date   Asthma     Current Outpatient Medications on File Prior to Visit  Medication Sig Dispense Refill   albuterol (PROVENTIL) (2.5 MG/3ML) 0.083% nebulizer solution Take 3 mLs (2.5 mg total) by nebulization every 6 (six) hours as needed for wheezing or shortness of breath. 75 mL 1   albuterol (VENTOLIN HFA) 108 (90 Base) MCG/ACT inhaler Inhale 2 puffs into the lungs every 6 (six) hours as needed for wheezing or shortness of breath. 8 g 1   Albuterol-Budesonide (AIRSUPRA) 90-80 MCG/ACT AERO Inhale 2 puffs into the lungs as needed (maximum 12 puffs/day). 10.7 g 5   azelastine (ASTELIN) 0.1 % nasal spray Place 1 spray into both nostrils 2 (two) times daily as needed. 30 mL 5   Budeson-Glycopyrrol-Formoterol (BREZTRI AEROSPHERE) 160-9-4.8 MCG/ACT AERO Inhale 2 puffs into the lungs in the morning and at bedtime. 10.7 g 5   cetirizine (ZYRTEC ALLERGY) 10 MG tablet Take 1 tablet (10 mg total) by mouth daily. 30 tablet 5   fluticasone (FLONASE) 50 MCG/ACT nasal spray Place 2 sprays into both nostrils daily. 16 g 5   fluticasone-salmeterol (ADVAIR DISKUS) 250-50 MCG/ACT AEPB Inhale 1 puff into the lungs in the morning and at bedtime. 60 each 5   levocetirizine (XYZAL) 5 MG tablet Take 1 tablet (5 mg total) by mouth every evening. 30 tablet 5   Multiple Vitamin (MULTIVITAMIN) capsule Take 1 capsule by mouth daily.     No current facility-administered medications on file prior to visit.    Family History  Problem Relation Age of Onset   Hypertension Mother    Hypertension Father    Hypertension Sister    Diabetes Maternal Aunt    Hypertension Maternal Aunt    Cancer Maternal Aunt    Breast cancer Other     Social History    Socioeconomic History   Marital status: Single    Spouse name: Not on file   Number of children: 3   Years of education: Not on file   Highest education level: Bachelor's degree (e.g., BA, AB, BS)  Occupational History   Not on file  Tobacco Use   Smoking status: Never   Smokeless tobacco: Never  Vaping Use   Vaping status: Never Used  Substance and Sexual Activity   Alcohol use: Never   Drug use: Never   Sexual activity: Yes    Birth control/protection: None  Other Topics Concern   Not on file  Social History Narrative   Not on file   Social Determinants of Health   Financial Resource Strain: Not on file  Food Insecurity: No Food Insecurity (10/09/2021)   Hunger Vital Sign    Worried About Running Out of Food in the Last Year: Never true    Ran Out of Food in the Last Year: Never true  Transportation Needs: No Transportation Needs (10/09/2021)   PRAPARE - Administrator, Civil Service (Medical): No    Lack of Transportation (Non-Medical): No  Physical Activity: Not on file  Stress: Not on file  Social Connections: Not on file  Intimate Partner Violence: Not on file    Review of Systems: ROS negative except for what is noted on the assessment and plan.  Vitals:  05/28/23 0856  BP: 126/68  Pulse: 70  Temp: 98 F (36.7 C)  TempSrc: Oral  SpO2: 99%  Weight: 211 lb 8 oz (95.9 kg)  Height: 5\' 1"  (1.549 m)    Physical Exam: Constitutional: Obese appearing woman , short hair,sitting in chair, responding appropriately to questions  Cardiovascular: Normal heart sounds ,strong pulses Pulmonary/Chest: lungs clear to auscultation bilaterally Abdominal: soft, non-tender, non-distended MSK: Good hand grip Psych: normal mood and behavior  Assessment & Plan:   Dyslipidemia Borderline LDL of a 100 during last visit, opted for lifestyle modification at that time.  - Repeat Lipid panel   Prediabetes Had A1c of 5.7 in 2022, opted for lifestyle  modification.Denies at polydipsia  or polyuria at this time  - Hemoglobin A1c today  Obesity (BMI 30-39.9) Hx of obesity currently not doing anything for it,BMI of 39.96. [place] exercise program adequately discussed with the patient to sign able when able.    Patient seen with Dr. Lavonna Monarch, M.D [facility] Phone: [phone] Date 05/28/2023 Time 6:48 PM

## 2023-05-28 NOTE — Patient Instructions (Signed)
Thank you, Ms.Ligia Gellis for allowing [place] to provide your care today. Today we discussed your general health. We will do some blood work today I will follow up with you when the results are back     I have ordered the following labs for you:  Lab Orders         CMP14 + Anion Gap         Lipid Profile         CBC no Diff         Hemoglobin A1c       Tests ordered today:    Referrals ordered today:   Referral Orders  No referral(s) requested today     I have ordered the following medication/changed the following medications:   Stop the following medications: There are no discontinued medications.   Start the following medications: No orders of the defined types were placed in this encounter.    Follow up: 1 year   Remember:   Should you have any questions or concerns please call the internal medicine clinic at [phone].    Kathleen Lime, M.D [facility]

## 2023-05-28 NOTE — Assessment & Plan Note (Addendum)
Hx of obesity currently not doing anything for it,BMI of 39.96. [place] exercise program adequately discussed with the patient to sign able when able.

## 2023-05-28 NOTE — Assessment & Plan Note (Signed)
Had A1c of 5.7 in 2022, opted for lifestyle modification.Denies at polydipsia  or polyuria at this time  - Hemoglobin A1c today

## 2023-05-28 NOTE — Assessment & Plan Note (Signed)
Borderline LDL of a 100 during last visit, opted for lifestyle modification at that time.  - Repeat Lipid panel

## 2023-05-29 ENCOUNTER — Other Ambulatory Visit: Payer: Self-pay | Admitting: Student

## 2023-05-29 DIAGNOSIS — R7303 Prediabetes: Secondary | ICD-10-CM

## 2023-05-29 DIAGNOSIS — E669 Obesity, unspecified: Secondary | ICD-10-CM

## 2023-05-30 LAB — CBC
Hematocrit: 42 % (ref 34.0–46.6)
Hemoglobin: 12.6 g/dL (ref 11.1–15.9)
MCH: 21.8 pg — ABNORMAL LOW (ref 26.6–33.0)
MCHC: 30 g/dL — ABNORMAL LOW (ref 31.5–35.7)
MCV: 73 fL — ABNORMAL LOW (ref 79–97)
Platelets: 189 10*3/uL (ref 150–450)
RBC: 5.77 x10E6/uL — ABNORMAL HIGH (ref 3.77–5.28)
RDW: 15 % (ref 11.7–15.4)
WBC: 4.8 10*3/uL (ref 3.4–10.8)

## 2023-05-30 LAB — LIPID PANEL
Chol/HDL Ratio: 3.1 ratio (ref 0.0–4.4)
Cholesterol, Total: 193 mg/dL (ref 100–199)
HDL: 63 mg/dL (ref >39)
LDL Chol Calc (NIH): 121 mg/dL — ABNORMAL HIGH (ref 0–99)
Triglycerides: 48 mg/dL (ref 0–149)
VLDL Cholesterol Cal: 9 mg/dL (ref 5–40)

## 2023-05-30 LAB — CMP14 + ANION GAP
ALT: 15 IU/L (ref 0–32)
AST: 16 IU/L (ref 0–40)
Albumin: 4.5 g/dL (ref 3.9–4.9)
Alkaline Phosphatase: 80 IU/L (ref 44–121)
Anion Gap: 14 mmol/L (ref 10.0–18.0)
BUN/Creatinine Ratio: 15 (ref 9–23)
BUN: 14 mg/dL (ref 6–24)
Bilirubin Total: 0.3 mg/dL (ref 0.0–1.2)
CO2: 21 mmol/L (ref 20–29)
Calcium: 9.1 mg/dL (ref 8.7–10.2)
Chloride: 105 mmol/L (ref 96–106)
Creatinine, Ser: 0.96 mg/dL (ref 0.57–1.00)
Globulin, Total: 2.3 g/dL (ref 1.5–4.5)
Glucose: 96 mg/dL (ref 70–99)
Potassium: 4.6 mmol/L (ref 3.5–5.2)
Sodium: 140 mmol/L (ref 134–144)
Total Protein: 6.8 g/dL (ref 6.0–8.5)
eGFR: 72 mL/min/{1.73_m2} (ref >59)

## 2023-05-30 LAB — HEMOGLOBIN A1C
Est. average glucose Bld gHb Est-mCnc: 123 mg/dL
Hgb A1c MFr Bld: 5.9 % — ABNORMAL HIGH (ref 4.8–5.6)

## 2023-06-10 ENCOUNTER — Other Ambulatory Visit: Payer: Self-pay

## 2023-06-10 ENCOUNTER — Ambulatory Visit (INDEPENDENT_AMBULATORY_CARE_PROVIDER_SITE_OTHER): Payer: 59 | Admitting: Internal Medicine

## 2023-06-10 ENCOUNTER — Encounter: Payer: Self-pay | Admitting: Internal Medicine

## 2023-06-10 VITALS — BP 124/76 | HR 67 | Temp 98.8°F | Resp 18 | Ht 60.98 in | Wt 210.8 lb

## 2023-06-10 DIAGNOSIS — J302 Other seasonal allergic rhinitis: Secondary | ICD-10-CM | POA: Diagnosis not present

## 2023-06-10 DIAGNOSIS — Z8709 Personal history of other diseases of the respiratory system: Secondary | ICD-10-CM

## 2023-06-10 DIAGNOSIS — J4540 Moderate persistent asthma, uncomplicated: Secondary | ICD-10-CM | POA: Diagnosis not present

## 2023-06-10 DIAGNOSIS — J3089 Other allergic rhinitis: Secondary | ICD-10-CM

## 2023-06-10 NOTE — Progress Notes (Signed)
FOLLOW UP Date of Service/Encounter:  06/10/23   Subjective:  Sherry Edwards (DOB: 26-Aug-1971) is a 52 y.o. female who [facility] on 06/10/2023 for follow up for moderate persistent asthma and allergic rhinoconjunctivitis.   History obtained from: chart review and patient. Last visit was on 04/25/2023 for uncontrolled asthma, allergic rhinoconjunctvitis.  SPT was positive for multiple allergens.  Discussed stopping Advair and starting Breztri with spacer.  Also has Airsupra for rescue. For rhinitis, she is on Flonase, Azelastine, oral anti histamine; discussed ENT referral.   Asthma: Doing okay.  Reports some SOB with activity requiring Air Supra rescue use about 1x/week, rarely 2x/week.  She is trying to work on losing weight; does eat a carb heavy diet and not exercising regularly.  No wheezing episodes since last visit. No other ER visits/oral prednisone; did get oral prednisone at 01/2023 and 09/2022 visit.    Rhinoconjunctivitis:  Having trouble with congestion, drainage, sneezing.  Reports using Flonase/Azelastine/oral anti histamine PRN. Has a history of nasal polyps, not seeing anyone for it and the ENT she saw previously is now out of network.   Past Medical History: Past Medical History:  Diagnosis Date   Asthma     Objective:  BP 124/76   Pulse 67   Temp 98.8 F (37.1 C)   Resp 18   Ht 5' 0.98" (1.549 m)   Wt 210 lb 12.8 oz (95.6 kg)   SpO2 99%   BMI 39.85 kg/m  Body mass index is 39.85 kg/m. Physical Exam: GEN: alert, well developed HEENT: clear conjunctiva, TM grey and translucent, nose with moderate inferior turbinate hypertrophy, boggy nasal mucosa, clear rhinorrhea, + cobblestoning, no polyps noted  HEART: regular rate and rhythm, no murmur LUNGS: clear to auscultation bilaterally, no coughing, unlabored respiration SKIN: no rashes or lesions  Spirometry:  Tracings reviewed. Her effort: Good reproducible efforts. FVC:  2.31L FEV1: 1.89L, 134% predicted FEV1/FVC ratio: 82% Interpretation: Spirometry consistent with normal pattern.  Please see scanned spirometry results for details.  Assessment:   1. Moderate persistent asthma without complication   2. Seasonal and perennial allergic rhinitis   3. History of nasal polyp     Plan/Recommendations:  Moderate Persistent Asthma - Normal spirometry today.  Controlled but not well controlled as we have gotten 2 courses of oral prednisone but have escalated therapy since then to triple inhaler.  Will also consider a biologic if she continues to flare up.  - Maintenance inhaler: continue Breztri 2 puffs twice daily with spacer.   - Rescue inhaler: Airsupra 2 puffs as needed for respiratory symptoms of cough, shortness of breath, or wheezing. Maximum dose 12 puffs in a day. Asthma control goals:  Full participation in all desired activities (may need albuterol before activity) Albuterol use two times or less a week on average (not counting use with activity) Cough interfering with sleep two times or less a month Oral steroids no more than once a year No hospitalizations    Allergic Rhinitis Hx of Nasal Polyps  - Positive skin test 04/2023: trees, grasses, weeds, mold - Avoidance measures discussed. - Use nasal saline rinses before nose sprays such as with Neilmed Sinus Rinse.  Use distilled water.   - Will get Ryaltris samples.  - Use Flonase or Nasacort 2 sprays each nostril daily. Aim upward and outward. - Use Azelastine 1-2 sprays each nostril twice daily. Aim upward and outward. - Use Zyrtec 10 mg daily or Xyzal 5mg  daily or Allegra  180mg  daily - Consider allergy shots as long term control of your symptoms by teaching your immune system to be more tolerant of your allergy triggers. - Will refer to ENT for further evaluation.  Has uncontrolled upper airway with hx of nasal polyps and no longer in network with the ENT she saw previously.     Return in  about 3 months (around 09/10/2023).  Alesia Morin, MD [facility]

## 2023-06-10 NOTE — Addendum Note (Signed)
Addended by: Orson Aloe on: 06/10/2023 04:40 PM   Modules accepted: Orders

## 2023-06-10 NOTE — Patient Instructions (Addendum)
Jacolyn Kendricks No follow-ups on file.   Moderate Persistent Asthma - Maintenance inhaler: continue Breztri 2 puffs twice daily with spacer.   - Rescue inhaler: Airsupra 2 puffs as needed for respiratory symptoms of cough, shortness of breath, or wheezing. Maximum dose 12 puffs in a day. Asthma control goals:  Full participation in all desired activities (may need albuterol before activity) Albuterol use two times or less a week on average (not counting use with activity) Cough interfering with sleep two times or less a month Oral steroids no more than once a year No hospitalizations    Allergic Rhinitis: - Positive skin test 04/2023: trees, grasses, weeds, mold - Avoidance measures discussed. - Use nasal saline rinses before nose sprays such as with Neilmed Sinus Rinse.  Use distilled water.   - Will get Ryaltris samples.  - Use Flonase or Nasacort 2 sprays each nostril daily. Aim upward and outward. - Use Azelastine 1-2 sprays each nostril twice daily. Aim upward and outward. - Use Zyrtec 10 mg daily or Xyzal 5mg  daily or Allegra 180mg  daily - Consider allergy shots as long term control of your symptoms by teaching your immune system to be more tolerant of your allergy triggers. - Will refer to ENT for further evaluation.

## 2023-06-11 ENCOUNTER — Telehealth: Payer: Self-pay

## 2023-06-11 NOTE — Telephone Encounter (Signed)
Dr. Christia Reading on 07/08/2023 @ 3:40 [facility] [place] [address] [place], [place] [address] [phone]  Patient has been placed on a cancellation list.  Patient has been informed of this information. Referral notes have been faxed.

## 2023-06-11 NOTE — Telephone Encounter (Signed)
-----   Message from Birder Robson sent at 06/10/2023  4:26 PM EDT ----- Hello  I would like for her to see ENT please.  Thanks [employer]

## 2023-06-16 ENCOUNTER — Other Ambulatory Visit (HOSPITAL_COMMUNITY): Payer: Self-pay

## 2023-06-19 ENCOUNTER — Other Ambulatory Visit (HOSPITAL_COMMUNITY): Payer: Self-pay

## 2023-06-26 NOTE — Progress Notes (Signed)
Internal Medicine Clinic Attending  I was physically present during the key portions of the resident provided service and participated in the medical decision making of patient's management care. I reviewed pertinent patient test results.  The assessment, diagnosis, and plan were formulated together and I agree with the documentation in the resident's note.  Williams, Julie Anne, MD  

## 2023-07-04 ENCOUNTER — Other Ambulatory Visit: Payer: Self-pay | Admitting: Internal Medicine

## 2023-07-04 ENCOUNTER — Other Ambulatory Visit (HOSPITAL_COMMUNITY): Payer: Self-pay

## 2023-07-04 DIAGNOSIS — J3089 Other allergic rhinitis: Secondary | ICD-10-CM

## 2023-07-04 MED ORDER — RYALTRIS 665-25 MCG/ACT NA SUSP
1.0000 | Freq: Two times a day (BID) | NASAL | 5 refills | Status: DC | PRN
Start: 2023-07-04 — End: 2023-07-04
  Filled 2023-07-04: qty 29, fill #0

## 2023-07-04 MED ORDER — RYALTRIS 665-25 MCG/ACT NA SUSP
1.0000 | Freq: Two times a day (BID) | NASAL | 5 refills | Status: DC | PRN
Start: 2023-07-04 — End: 2023-08-29

## 2023-07-04 MED ORDER — RYALTRIS 665-25 MCG/ACT NA SUSP
1.0000 | Freq: Two times a day (BID) | NASAL | 5 refills | Status: DC | PRN
Start: 2023-07-04 — End: 2023-07-04

## 2023-07-04 NOTE — Progress Notes (Signed)
Ryaltris sent to [facility].

## 2023-07-04 NOTE — Addendum Note (Signed)
Addended by: Philipp Deputy on: 07/04/2023 04:07 PM   Modules accepted: Orders

## 2023-07-04 NOTE — Addendum Note (Signed)
Addended by: Philipp Deputy on: 07/04/2023 04:23 PM   Modules accepted: Orders

## 2023-07-08 DIAGNOSIS — J329 Chronic sinusitis, unspecified: Secondary | ICD-10-CM | POA: Diagnosis not present

## 2023-07-17 ENCOUNTER — Other Ambulatory Visit (HOSPITAL_COMMUNITY): Payer: Self-pay

## 2023-07-25 DIAGNOSIS — J3489 Other specified disorders of nose and nasal sinuses: Secondary | ICD-10-CM | POA: Diagnosis not present

## 2023-07-25 DIAGNOSIS — J329 Chronic sinusitis, unspecified: Secondary | ICD-10-CM | POA: Diagnosis not present

## 2023-08-22 ENCOUNTER — Other Ambulatory Visit (HOSPITAL_COMMUNITY): Payer: Self-pay

## 2023-08-29 ENCOUNTER — Encounter: Payer: Self-pay | Admitting: Internal Medicine

## 2023-08-29 ENCOUNTER — Other Ambulatory Visit (HOSPITAL_COMMUNITY): Payer: Self-pay

## 2023-08-29 ENCOUNTER — Other Ambulatory Visit: Payer: Self-pay

## 2023-08-29 ENCOUNTER — Ambulatory Visit (INDEPENDENT_AMBULATORY_CARE_PROVIDER_SITE_OTHER): Payer: 59 | Admitting: Internal Medicine

## 2023-08-29 VITALS — BP 122/70 | HR 67 | Temp 98.3°F | Resp 16 | Wt 210.1 lb

## 2023-08-29 DIAGNOSIS — J324 Chronic pansinusitis: Secondary | ICD-10-CM

## 2023-08-29 DIAGNOSIS — J302 Other seasonal allergic rhinitis: Secondary | ICD-10-CM | POA: Diagnosis not present

## 2023-08-29 DIAGNOSIS — J4540 Moderate persistent asthma, uncomplicated: Secondary | ICD-10-CM

## 2023-08-29 DIAGNOSIS — J3089 Other allergic rhinitis: Secondary | ICD-10-CM

## 2023-08-29 MED ORDER — AZELASTINE-FLUTICASONE 137-50 MCG/ACT NA SUSP
2.0000 | Freq: Two times a day (BID) | NASAL | 5 refills | Status: DC
  Filled 2023-08-29: qty 23, 30d supply, fill #0

## 2023-08-29 MED ORDER — BREZTRI AEROSPHERE 160-9-4.8 MCG/ACT IN AERO
2.0000 | INHALATION_SPRAY | Freq: Two times a day (BID) | RESPIRATORY_TRACT | 5 refills | Status: DC
  Filled 2023-08-29 – 2023-10-01 (×2): qty 10.7, 30d supply, fill #0
  Filled 2023-11-15: qty 10.7, 30d supply, fill #1

## 2023-08-29 MED ORDER — AIRSUPRA 90-80 MCG/ACT IN AERO
2.0000 | INHALATION_SPRAY | RESPIRATORY_TRACT | 5 refills | Status: DC | PRN
  Filled 2023-08-29 – 2024-01-01 (×2): qty 10.7, 16d supply, fill #0

## 2023-08-29 MED ORDER — LEVOCETIRIZINE DIHYDROCHLORIDE 5 MG PO TABS
5.0000 mg | ORAL_TABLET | Freq: Every evening | ORAL | 5 refills | Status: DC
  Filled 2023-08-29 – 2023-10-13 (×2): qty 30, 30d supply, fill #0
  Filled 2023-11-15: qty 30, 30d supply, fill #1
  Filled 2024-01-01: qty 30, 30d supply, fill #2

## 2023-08-29 NOTE — Progress Notes (Signed)
FOLLOW UP Date of Service/Encounter:  08/29/23   Subjective:  Sherry Edwards (DOB: 13-Jan-1971) is a 52 y.o. female who [facility] on 08/29/2023 for follow up for asthma, chronic sinusitis, allergic rhinitis.   History obtained from: chart review and patient. Last visit was on 06/10/2023 and at the time her asthma was controlled, discussed continuation of Breztri since she had received oral prednisone x2 for flare ups throughout the last year.  In terms of rhinitis, it was uncontrolled so we referred her to ENT for further evaluation for possible polyps. [facility] ENT Dr Jenne Pane for chronic sinusitis. They recommended endoscopic sinus surgery.   CT Sinus 08/12/2023: Widespread mucosal thickening throughout the paranasal sinuses as outlined above. No layering fluid. Ostiomeatal units are obscured by regional mucosal thickening. Upper nasal passages are occluded due to mucosal thickening.   Reports breathing has been doing well.  Has not had much issues with SOB/wheezing.  Taking Breztri 2 puffs BID, sometimes needs air supra but not too frequently.  No ER visits/oral prednisone use since last visit.  Does note some throat irritation after use of Breztri.  Not gargling full glass of water/brushing. Still having a lot of trouble with frequent congestion, drainage, runny nose.  Did use ryaltris sample but had trouble with further fills. On oral anti histamine daily PRN, switches between Xyzal/Zyrtec.    Past Medical History: Past Medical History:  Diagnosis Date   Asthma     Objective:  BP 122/70   Pulse 67   Temp 98.3 F (36.8 C) (Temporal)   Resp 16   Wt 210 lb 1.6 oz (95.3 kg)   SpO2 100%   BMI 39.72 kg/m  Body mass index is 39.72 kg/m. Physical Exam: GEN: alert, well developed HEENT: clear conjunctiva, nose with moderate turbinate hypertrophy and clear rhinorrhea HEART: regular rate and rhythm, no murmur LUNGS: clear to auscultation bilaterally, no  coughing, unlabored respiration SKIN: no rashes or lesions  Spirometry:  Tracings reviewed. Her effort: Good reproducible efforts. FVC: 2.09L, 124% FEV1: 1.54L, 110% predicted FEV1/FVC ratio: 74% Interpretation: Spirometry consistent with normal pattern.  Please see scanned spirometry results for details.  Assessment:   1. Seasonal and perennial allergic rhinitis   2. Moderate persistent asthma without complication   3. Chronic pansinusitis     Plan/Recommendations:  Moderate Persistent Asthma - Well controlled. Spirometry today was normal.  Low threshold for biologic if she continues to need oral prednisone/flares up.  - Maintenance inhaler: continue Breztri 2 puffs twice daily with spacer.  Brush tongue/teeth and gargle a full glass of water after the use of inhaler.  - Rescue inhaler: Airsupra 2 puffs as needed for respiratory symptoms of cough, shortness of breath, or wheezing. Maximum dose 12 puffs in a day. Asthma control goals:  Full participation in all desired activities (may need albuterol before activity) Albuterol use two times or less a week on average (not counting use with activity) Cough interfering with sleep two times or less a month Oral steroids no more than once a year No hospitalizations   Allergic Rhinitis Chronic Sinusitis  - Uncontrolled  - Positive skin test 04/2023: trees, grasses, weeds, mold - Avoidance measures discussed. - Use nasal saline rinses before nose sprays such as with Neilmed Sinus Rinse.  Use distilled water.   - Use Dymista 2 sprays twice daily.  Aim upward and outward.  - Use Zyrtec 10 mg daily or Xyzal 5mg  daily or Allegra 180mg  daily - Consider  allergy shots as long term control of your symptoms by teaching your immune system to be more tolerant of your allergy triggers. - If no improvement with the regimen discussed above in the next 3 months, I would recommend revisiting ENT to discuss surgical options as the CT sinus showed  diffuse sinus disease with occlusion of upper nasal passages and obscured OM units.     Return in about 4 months (around 12/30/2023).  Alesia Morin, MD [facility] [place]

## 2023-08-29 NOTE — Patient Instructions (Addendum)
Moderate Persistent Asthma - Maintenance inhaler: continue Breztri 2 puffs twice daily with spacer.  Brush tongue/teeth and gargle a full glass of water after the use of inhaler.  - Rescue inhaler: Airsupra 2 puffs as needed for respiratory symptoms of cough, shortness of breath, or wheezing. Maximum dose 12 puffs in a day. Asthma control goals:  Full participation in all desired activities (may need albuterol before activity) Albuterol use two times or less a week on average (not counting use with activity) Cough interfering with sleep two times or less a month Oral steroids no more than once a year No hospitalizations   Allergic Rhinitis Chronic Sinusitis  - Positive skin test 04/2023: trees, grasses, weeds, mold - Avoidance measures discussed. - Use nasal saline rinses before nose sprays such as with Neilmed Sinus Rinse.  Use distilled water.   - Use Dymista 2 sprays twice daily.  Aim upward and outward. If not covered, will send in Ryaltris.  - Use Zyrtec 10 mg daily or Xyzal 5mg  daily or Allegra 180mg  daily - Consider allergy shots as long term control of your symptoms by teaching your immune system to be more tolerant of your allergy triggers. - If no improvement with the regimen discussed above in the next 2-3 months, I would recommend revisiting ENT to discuss surgical options as the CT sinus showed diffuse sinus disease with occlusion of upper nasal passages and obscured OM units.

## 2023-08-30 ENCOUNTER — Other Ambulatory Visit (HOSPITAL_COMMUNITY): Payer: Self-pay

## 2023-09-01 ENCOUNTER — Other Ambulatory Visit (HOSPITAL_COMMUNITY): Payer: Self-pay

## 2023-09-03 ENCOUNTER — Other Ambulatory Visit (HOSPITAL_COMMUNITY): Payer: Self-pay

## 2023-09-09 ENCOUNTER — Other Ambulatory Visit (HOSPITAL_COMMUNITY): Payer: Self-pay

## 2023-09-09 ENCOUNTER — Other Ambulatory Visit: Payer: Self-pay | Admitting: Internal Medicine

## 2023-09-09 MED ORDER — AZELASTINE-FLUTICASONE 137-50 MCG/ACT NA SUSP
2.0000 | Freq: Two times a day (BID) | NASAL | 5 refills | Status: DC
  Filled 2023-09-09: qty 23, 30d supply, fill #0
  Filled 2023-10-13: qty 23, 30d supply, fill #1

## 2023-09-09 NOTE — Progress Notes (Signed)
Resending Dymista.

## 2023-09-25 ENCOUNTER — Telehealth: Payer: Self-pay | Admitting: *Deleted

## 2023-09-25 NOTE — Telephone Encounter (Signed)
Patient states that when she used the [place] she has been having issues with the medication irritating the back of her throat causing her to have throat clearing and slight cough from irritant. She states that she has been rinsing and gargling after use. She is wondering if she should go back to using her Advair? Please advise.

## 2023-09-26 NOTE — Telephone Encounter (Signed)
Thank you mam.

## 2023-10-01 ENCOUNTER — Other Ambulatory Visit (HOSPITAL_COMMUNITY): Payer: Self-pay

## 2023-10-13 ENCOUNTER — Other Ambulatory Visit (HOSPITAL_COMMUNITY): Payer: Self-pay

## 2023-10-14 ENCOUNTER — Other Ambulatory Visit (HOSPITAL_COMMUNITY): Payer: Self-pay

## 2023-10-16 ENCOUNTER — Other Ambulatory Visit (HOSPITAL_COMMUNITY): Payer: Self-pay

## 2023-10-29 ENCOUNTER — Ambulatory Visit: Payer: 59 | Admitting: Student

## 2023-10-29 ENCOUNTER — Other Ambulatory Visit (HOSPITAL_COMMUNITY): Payer: Self-pay

## 2023-10-29 VITALS — BP 132/75 | HR 63 | Temp 98.5°F | Ht 60.98 in | Wt 208.1 lb

## 2023-10-29 DIAGNOSIS — E669 Obesity, unspecified: Secondary | ICD-10-CM

## 2023-10-29 DIAGNOSIS — R7303 Prediabetes: Secondary | ICD-10-CM

## 2023-10-29 DIAGNOSIS — F32A Depression, unspecified: Secondary | ICD-10-CM | POA: Diagnosis not present

## 2023-10-29 DIAGNOSIS — M79671 Pain in right foot: Secondary | ICD-10-CM

## 2023-10-29 DIAGNOSIS — Z6839 Body mass index (BMI) 39.0-39.9, adult: Secondary | ICD-10-CM | POA: Diagnosis not present

## 2023-10-29 DIAGNOSIS — R452 Unhappiness: Secondary | ICD-10-CM

## 2023-10-29 MED ORDER — SERTRALINE HCL 50 MG PO TABS
50.0000 mg | ORAL_TABLET | Freq: Every day | ORAL | 1 refills | Status: DC
  Filled 2023-10-29: qty 30, 30d supply, fill #0

## 2023-10-29 NOTE — Patient Instructions (Addendum)
Thank you, Ms.Marigene Deford for allowing [place] to provide your care today.   Today we discussed :  For heel pain, take naproxen 200 mg twice a day for 2 weeks.  Continue to take Tylenol as needed for pain.   2.  For depression, please start sertraline 50 mg once a day.  It takes up to 4 to 6 weeks to see significant improvement in your mood. -I will also put in a referral for you to see a therapist.  3.  For prediabetes, I will put in a referral to talk with a nutritionist.  I have ordered the following labs for you:  Lab Orders  No laboratory test(s) ordered today      Referrals ordered today:   Referral Orders  No referral(s) requested today     I have ordered the following medication/changed the following medications:   Stop the following medications: There are no discontinued medications.   Start the following medications: No orders of the defined types were placed in this encounter.    Follow up: 2 months     Should you have any questions or concerns please call the internal medicine clinic at [phone].    Laretta Bolster, MD  [facility]

## 2023-10-29 NOTE — Progress Notes (Unsigned)
CC: right foot pain, depressed mood  HPI:  [name] is a 52 y.o. female living with a history stated below and presents today for right foot pain and depressed mood.  Please see problem based assessment and plan for additional details.  Past Medical History:  Diagnosis Date   Asthma     Current Outpatient Medications on File Prior to Visit  Medication Sig Dispense Refill   albuterol (PROVENTIL) (2.5 MG/3ML) 0.083% nebulizer solution Take 3 mLs (2.5 mg total) by nebulization every 6 (six) hours as needed for wheezing or shortness of breath. 75 mL 1   Albuterol-Budesonide (AIRSUPRA) 90-80 MCG/ACT AERO Inhale 2 puffs into the lungs as needed (maximum 12 puffs/day). 10.7 g 5   azelastine (ASTELIN) 0.1 % nasal spray Place 1 spray into both nostrils 2 (two) times daily as needed. 30 mL 5   Azelastine-Fluticasone 137-50 MCG/ACT SUSP Place 2 sprays into the nose in the morning and at bedtime. 23 g 5   Budeson-Glycopyrrol-Formoterol (BREZTRI AEROSPHERE) 160-9-4.8 MCG/ACT AERO Inhale 2 puffs into the lungs in the morning and at bedtime. 10.7 g 5   cetirizine (ZYRTEC ALLERGY) 10 MG tablet Take 1 tablet (10 mg total) by mouth daily. 30 tablet 5   fluticasone (FLONASE) 50 MCG/ACT nasal spray Place 2 sprays into both nostrils daily. 16 g 5   levocetirizine (XYZAL) 5 MG tablet Take 1 tablet (5 mg total) by mouth every evening. 30 tablet 5   Multiple Vitamin (MULTIVITAMIN) capsule Take 1 capsule by mouth daily.     No current facility-administered medications on file prior to visit.     Review of Systems: ROS negative except for what is noted on the assessment and plan.  Vitals:   10/29/23 0829  BP: 132/75  Pulse: 63  Temp: 98.5 F (36.9 C)  TempSrc: Oral  SpO2: 100%  Weight: 208 lb 1.6 oz (94.4 kg)  Height: 5' 0.98" (1.549 m)    Physical Exam: Constitutional: Well appearing, not in acute distress. Cardiovascular: regular rate and rhythm, no m/r/g Pulmonary/Chest: normal  work of breathing on room air, lungs clear to auscultation bilaterally Extremities: Right heel with tenderness at the site of ligament attachment to medial calcaneus. No swelling or erythema. Abdominal: soft, non-tender, non-distended  Assessment & Plan:  Patient seen with Dr. Mayford Knife  Depressed mood with feeling of loneliness Depressed Mood:  Prior history of depression, was treated with Lexapro.  Currently not on any medicine.  She is endorsing depressed mood, sleep changes, lack of interest and fatigue. Patient's PHQ-9 score is 15.  She denies active SI or HI.  Discussed starting sertraline at 50 mg.  In the past, she was hesitant to referral to the behavioral health to see a therapist, however she is open to that as well.  -Referral to behavioral health placed. -Start sertraline 50 mg.  Pain of right heel She has a 1 to 42-month history of medial right heel pain worse at the end of the day and worse with weightbearing.  Denies any recent trauma.   On exam, right heel with no erythema or swelling. There is tenderness at the site of insertion of ligament to medial calcaneus on the right foot.  No tenderness at the bony heel. I dont think sxs are consistent with plantar fascitis or bony heel. I query if there is a subtle soft tissue infection.  P: -advised to ice the heel -Take naproxen 20 mg twice a day for 2 weeks consistently. -If pain does not improve,  consider referral to podiatry or x-ray of the right foot.  Obesity (BMI 30-39.9) A1c 5.9 2 months ago.  Not endorsing polydipsia or polyuria at this visit.  Emphasized healthy diet. She will benefit from referral to a nutritionist. - will refer pt to healthy weight vs. Nutritionist.   Laretta Bolster, MD [facility] Internal Medicine, PGY-1 Phone: [phone] Date 10/30/2023 Time 11:15 AM

## 2023-10-30 DIAGNOSIS — M79671 Pain in right foot: Secondary | ICD-10-CM | POA: Insufficient documentation

## 2023-10-30 NOTE — Assessment & Plan Note (Signed)
She has a 1 to 32-month history of medial right heel pain worse at the end of the day and worse with weightbearing.  Denies any recent trauma.   On exam, right heel with no erythema or swelling. There is tenderness at the site of insertion of ligament to medial calcaneus on the right foot.  No tenderness at the bony heel. I dont think sxs are consistent with plantar fascitis or bony heel. I query if there is a subtle soft tissue infection.  P: -advised to ice the heel -Take naproxen 20 mg twice a day for 2 weeks consistently. -If pain does not improve, consider referral to podiatry or x-ray of the right foot.

## 2023-10-30 NOTE — Assessment & Plan Note (Signed)
A1c 5.9 2 months ago.  Not endorsing polydipsia or polyuria at this visit.  Emphasized healthy diet. She will benefit from referral to a nutritionist. - will refer pt to healthy weight vs. Nutritionist.

## 2023-10-30 NOTE — Assessment & Plan Note (Signed)
Depressed Mood:  Prior history of depression, was treated with Lexapro.  Currently not on any medicine.  Sherry Edwards is endorsing depressed mood, sleep changes, lack of interest and fatigue. Patient's PHQ-9 score is 15.  Sherry Edwards denies active SI or HI.  Discussed starting sertraline at 50 mg.  In the past, Sherry Edwards was hesitant to referral to the behavioral health to see a therapist, however Sherry Edwards is open to that as well.  -Referral to behavioral health placed. -Start sertraline 50 mg.

## 2023-10-31 ENCOUNTER — Other Ambulatory Visit (HOSPITAL_COMMUNITY): Payer: Self-pay

## 2023-11-10 IMAGING — MG MM DIGITAL SCREENING BILAT W/ TOMO AND CAD
8 series · 8 of 24 positions shown · non-contrast
Comparison: Previous exam(s).

CLINICAL DATA: Screening.

EXAM:
DIGITAL SCREENING BILATERAL MAMMOGRAM WITH TOMOSYNTHESIS AND CAD
TECHNIQUE: Bilateral screening digital craniocaudal and mediolateral oblique
mammograms were obtained. Bilateral screening digital breast
tomosynthesis was performed. The images were evaluated with
computer-aided detection.

[L MLO synth-2D]
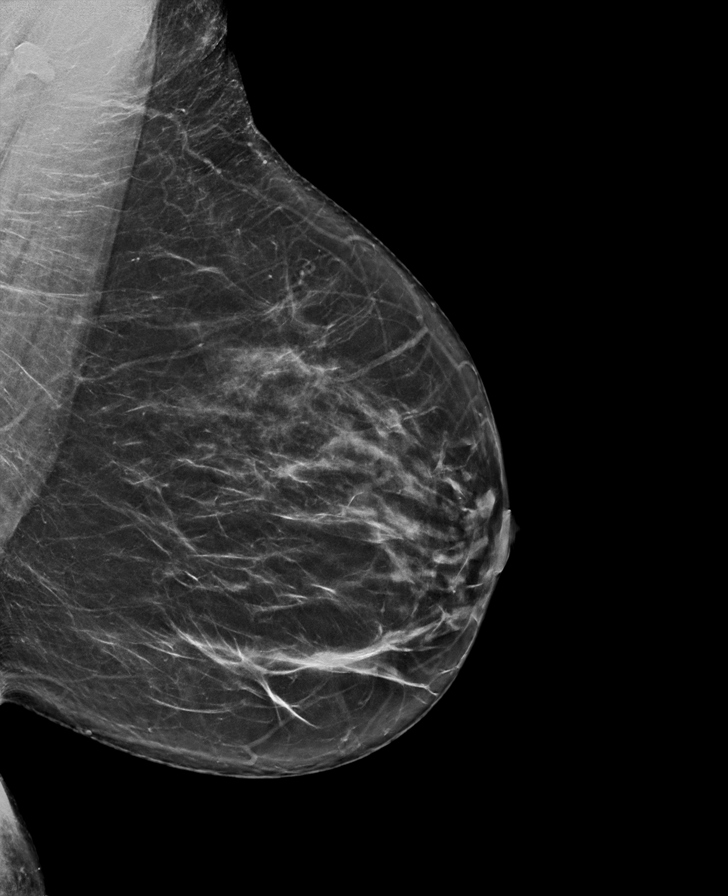

[R MLO synth-2D]
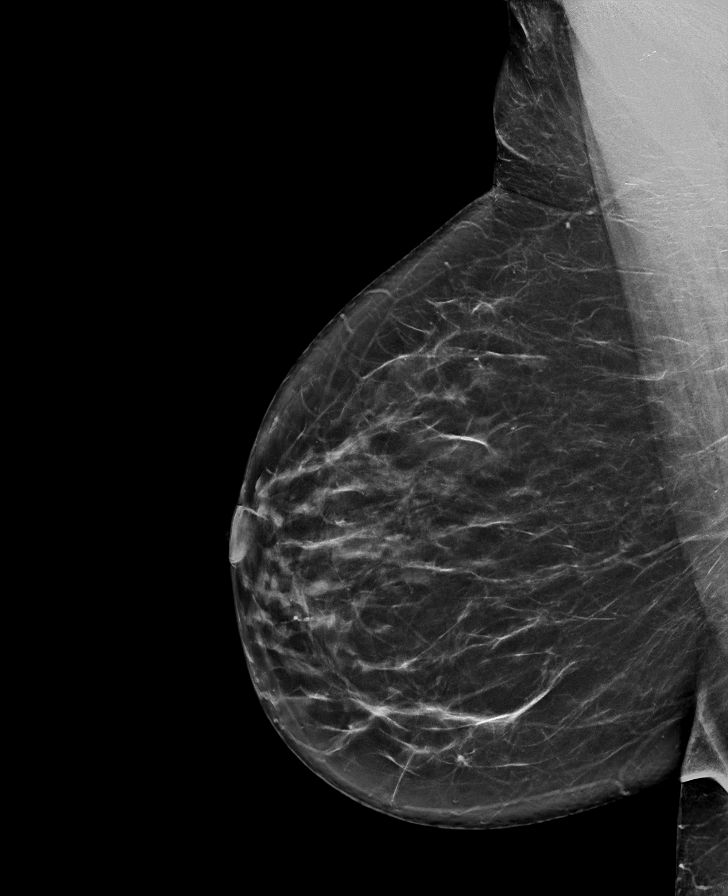

[R CC synth-2D]
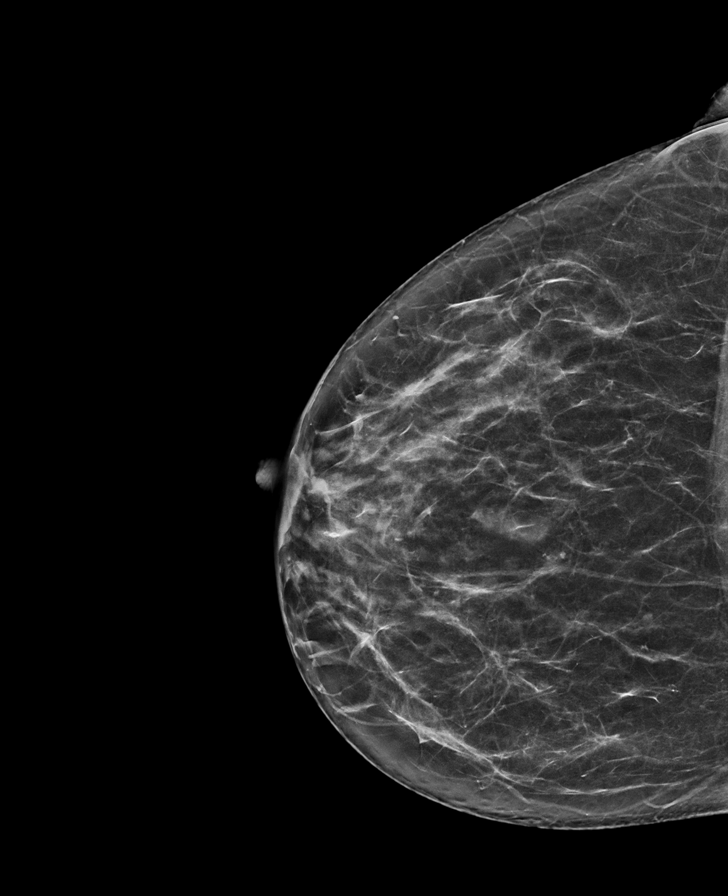

[L CC synth-2D]
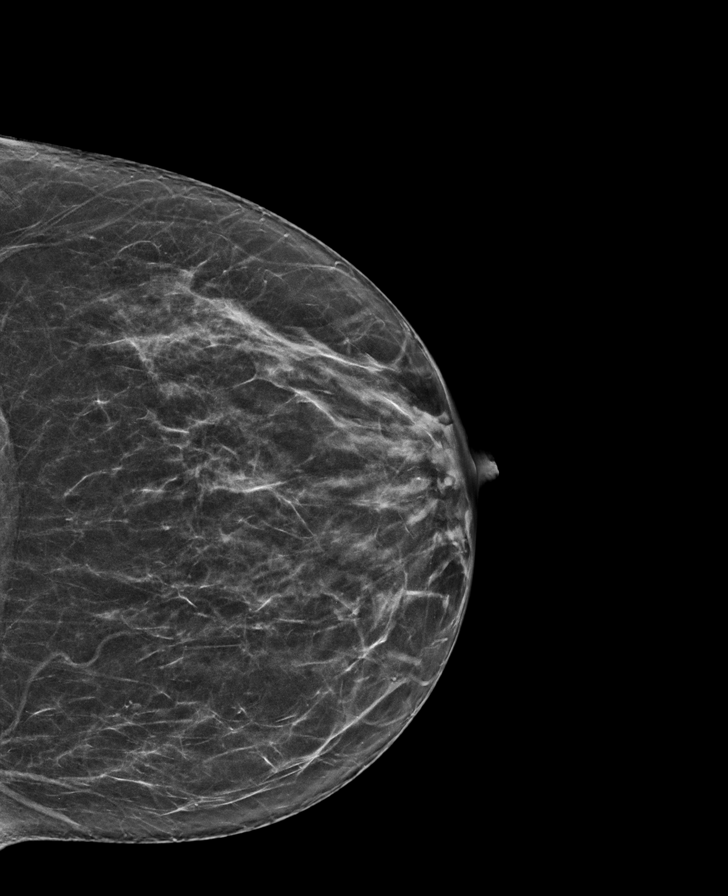

[L CC tomo · tomo slice 34/67.0]
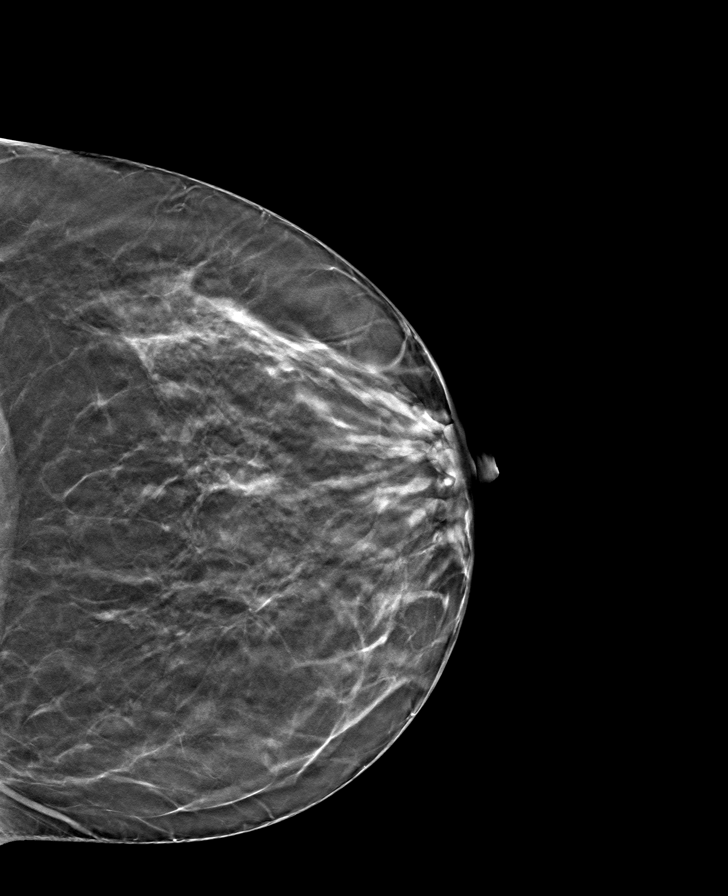

[R CC tomo · tomo slice 37/73.0]
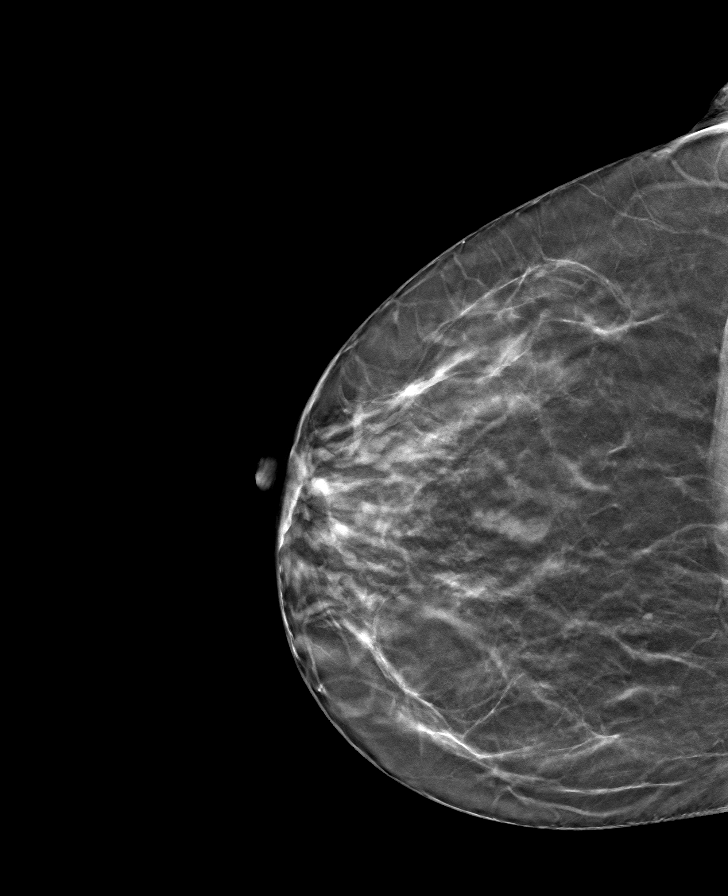

[L MLO tomo · tomo slice 39/78.0]
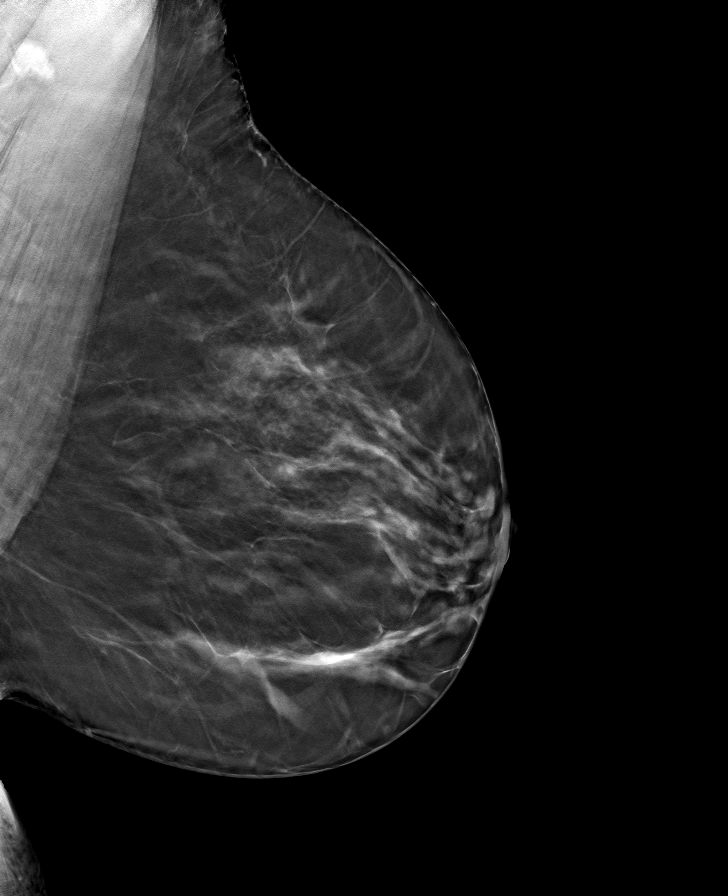

[R MLO tomo · tomo slice 40/79.0]
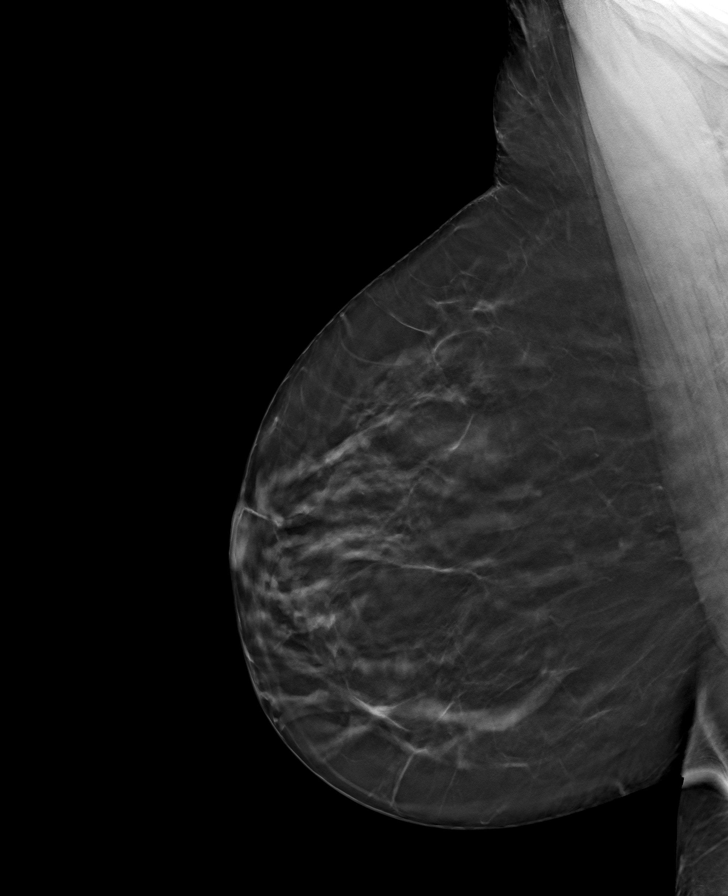

[8 of 24 positions shown; findings below may reference images not displayed]

ACR Breast Density Category b: There are scattered areas of
fibroglandular density.
FINDINGS: There are no findings suspicious for malignancy.
IMPRESSION: No mammographic evidence of malignancy. A result letter of this
screening mammogram will be mailed directly to the patient.

RECOMMENDATION:
Screening mammogram in one year. (Code:51-O-LD2)

BI-RADS CATEGORY  1: Negative.

## 2023-11-10 NOTE — Progress Notes (Signed)
Internal Medicine Clinic Attending  I was physically present during the key portions of the resident provided service and participated in the medical decision making of patient's management care. I reviewed pertinent patient test results.  The assessment, diagnosis, and plan were formulated together and I agree with the documentation in the resident's note.  No concern for infection.    Miguel Aschoff, MD

## 2023-11-19 ENCOUNTER — Other Ambulatory Visit (HOSPITAL_COMMUNITY): Payer: Self-pay

## 2023-11-24 ENCOUNTER — Ambulatory Visit (INDEPENDENT_AMBULATORY_CARE_PROVIDER_SITE_OTHER): Payer: 59

## 2023-11-24 ENCOUNTER — Other Ambulatory Visit (HOSPITAL_BASED_OUTPATIENT_CLINIC_OR_DEPARTMENT_OTHER): Payer: Self-pay

## 2023-11-24 ENCOUNTER — Other Ambulatory Visit (HOSPITAL_COMMUNITY): Payer: Self-pay

## 2023-11-24 ENCOUNTER — Ambulatory Visit (INDEPENDENT_AMBULATORY_CARE_PROVIDER_SITE_OTHER): Payer: 59 | Admitting: Podiatry

## 2023-11-24 ENCOUNTER — Telehealth: Payer: Self-pay | Admitting: Podiatry

## 2023-11-24 DIAGNOSIS — M7751 Other enthesopathy of right foot: Secondary | ICD-10-CM | POA: Diagnosis not present

## 2023-11-24 DIAGNOSIS — M722 Plantar fascial fibromatosis: Secondary | ICD-10-CM | POA: Diagnosis not present

## 2023-11-24 DIAGNOSIS — M79671 Pain in right foot: Secondary | ICD-10-CM | POA: Diagnosis not present

## 2023-11-24 DIAGNOSIS — M778 Other enthesopathies, not elsewhere classified: Secondary | ICD-10-CM

## 2023-11-24 MED ORDER — MELOXICAM 7.5 MG PO TABS
7.5000 mg | ORAL_TABLET | Freq: Every day | ORAL | 0 refills | Status: DC
  Filled 2023-11-24: qty 30, 30d supply, fill #0

## 2023-11-24 MED ORDER — TRIAMCINOLONE ACETONIDE 10 MG/ML IJ SUSP
10.0000 mg | Freq: Once | INTRAMUSCULAR | Status: AC
  Administered 2023-11-24: 10 mg

## 2023-11-24 NOTE — Patient Instructions (Signed)

## 2023-11-24 NOTE — Progress Notes (Signed)
 HPI: 53 y.o. female presenting today with c/o pain in the bottom of the right heel.  Pain is rated as 8/10.  This started in October 2024 when she was going on a longer than normal walk during the lunch hour.  She did not realize she had to walk faster to try to make it back to work on time.  She began feeling the pain after this walk and it has progressively worsening and becoming more frequent.  She also has pain with for steps out of bed in the morning.  Denies any bruising or swelling.  Past Medical History:  Diagnosis Date   Asthma    Past Surgical History:  Procedure Laterality Date   HERNIA REPAIR      Physical Exam: General: The patient is alert and oriented x3 in no acute distress.  Dermatology:  No ecchymosis, erythema, or edema bilateral.  No open lesions.  Skin is dry to the plantar aspect of the foot  Vascular: Palpable pedal pulses bilaterally. Capillary refill within normal limits.  Minimal lower extremity edema bilateral legs  Neurological: Light touch sensation intact bilateral.  MMT 5/5 to lower extremity bilateral.   Musculoskeletal Exam:  There is pain on palpation of the plantarmedial & plantarcentral aspect of right heel.  No gaps or nodules within the plantar fascia.  Positive Windlass mechanism bilateral.  Antalgic gait noted with first few steps upon standing.  No pain on palpation of achilles tendon bilateral.  Ankle df less than 10 degrees with knee extended b/l.  Radiographic Exam (right foot, 3 weightbearing views, 11/24/2023):  Normal osseous mineralization. Joint spaces preserved.  No fractures noted.  There is medial angulation of the fourth and fifth toes at the level of the PIP joint.  Assessment/Plan of Care: 1. Plantar fasciitis of right foot   2. Capsulitis of foot   3. Bursitis of right foot   4. Inflammatory heel pain, right     Meds ordered this encounter  Medications   meloxicam  (MOBIC ) 7.5 MG tablet    Sig: Take 1 tablet (7.5 mg total)  by mouth daily. Do not take ibuprofen or naprosyn while taking this medication.  Tylenol is okay to take while taking this medication.  Take with food.    Dispense:  30 tablet    Refill:  0   triamcinolone  acetonide (KENALOG ) 10 MG/ML injection 10 mg   FOR HOME USE ONLY DME NIGHT SPLINT PR FT ARCH SUPRT PREMOLD LONGIT  -Reviewed etiology of plantar fasciitis with patient.  Discussed treatment options with patient today, including cortisone injection, NSAID course of treatment, stretching exercises, physical therapy, use of night splint, rest, icing the heel, arch supports/orthotics, and supportive shoe gear.    With the patient's verbal consent, a corticosteroid injection was administered to the right heel, consisting of a mixture of 1% lidocaine plain, 0.5% Sensorcaine plain, and Kenalog -10 for a total of 1.5cc administered.  A Band-aid was applied. Pain level post-injection is 4/10.  She was fitted for power step inserts.  She was instructed on how to put these into her shoes.  She was fitted for a right night splint, which is a static AFO to be worn when nonweightbearing or sleeping.  Has a soft interface material.  She will remove this when she goes to get out of bed in the morning.  Prescription for meloxicam  7.5 mg sent in for 1 tablet daily with food.  She can continue with Tylenol as needed, but needs to avoid combining  this with ibuprofen or Naprosyn products.  Return in about 4 weeks (around 12/22/2023) for f/u plantar fasciitis.   Awanda CHARM Imperial, DPM, FACFAS [facility]     [address] [place], [place] [address]                Office [phone]  Fax [phone]

## 2023-11-24 NOTE — Telephone Encounter (Signed)
 Patient wanted to know if the medication and exercises from today's visit will help with her other foot?

## 2023-12-01 ENCOUNTER — Ambulatory Visit: Payer: 59 | Admitting: Podiatry

## 2023-12-30 ENCOUNTER — Encounter: Payer: 59 | Admitting: Internal Medicine

## 2023-12-30 ENCOUNTER — Encounter: Payer: Self-pay | Admitting: Podiatry

## 2023-12-30 ENCOUNTER — Ambulatory Visit (INDEPENDENT_AMBULATORY_CARE_PROVIDER_SITE_OTHER): Payer: 59 | Admitting: Podiatry

## 2023-12-30 DIAGNOSIS — M722 Plantar fascial fibromatosis: Secondary | ICD-10-CM

## 2023-12-30 NOTE — Progress Notes (Unsigned)
      HPI: 53 y.o. female presents today for follow-up of right plantar fasciitis.  Patient admits that she has not been compliant with all of the treatment recommendations, but she is still having some improvement.  Past Medical History:  Diagnosis Date   Asthma     Past Surgical History:  Procedure Laterality Date   HERNIA REPAIR      Physical Exam: There are palpable pedal pulses noted.  There is pain on palpation to the plantar medial aspect of the right heel.  Ankle dorsiflexion is less than 10 degrees with the knee extended.  Negative Tinel's sign of the posterior tibial nerve.  Manual muscle testing 5/5.  Positive windlass mechanism on the right.  Assessment/Plan of Care: 1. Plantar fasciitis of right foot    Discussed clinical findings with patient today.  I discussed treatment options.  Did offer cortisone injection to the patient, but it took her quite a while to feel comfortable with consenting to have it performed due to her preconceived opinion regarding prednisone.  Answered any questions that the patient had regarding this.  With the patient's verbal consent, a corticosteroid injection was adminstered to the right inferior heel.  This consisted of a mixture of 1% lidocaine plain, 0.5% sensorcaine plain, and Kenalog-10 for a total of 1.25cc's administered.  Bandaid applied. Patient did not tolerate this well.  Patient will try to be more compliant with her treatment recommendations, stretching, wearing supports, etc.    F/u prn    Corie Vavra DBurna Mortimer, DPM, FACFAS [facility]     [address] [place], [place] [address]                Office [phone]  Fax [phone]

## 2024-01-02 ENCOUNTER — Other Ambulatory Visit (HOSPITAL_COMMUNITY): Payer: Self-pay

## 2024-01-02 ENCOUNTER — Other Ambulatory Visit: Payer: Self-pay

## 2024-02-11 ENCOUNTER — Ambulatory Visit (HOSPITAL_COMMUNITY): Payer: Self-pay | Admitting: Licensed Clinical Social Worker

## 2024-03-23 ENCOUNTER — Ambulatory Visit (INDEPENDENT_AMBULATORY_CARE_PROVIDER_SITE_OTHER): Admitting: Allergy and Immunology

## 2024-03-23 ENCOUNTER — Other Ambulatory Visit (HOSPITAL_COMMUNITY): Payer: Self-pay

## 2024-03-23 VITALS — BP 126/76 | HR 73 | Temp 98.1°F | Resp 16

## 2024-03-23 DIAGNOSIS — J324 Chronic pansinusitis: Secondary | ICD-10-CM

## 2024-03-23 DIAGNOSIS — J4550 Severe persistent asthma, uncomplicated: Secondary | ICD-10-CM

## 2024-03-23 DIAGNOSIS — Z8709 Personal history of other diseases of the respiratory system: Secondary | ICD-10-CM | POA: Diagnosis not present

## 2024-03-23 DIAGNOSIS — J3089 Other allergic rhinitis: Secondary | ICD-10-CM

## 2024-03-23 DIAGNOSIS — J301 Allergic rhinitis due to pollen: Secondary | ICD-10-CM

## 2024-03-23 MED ORDER — TRIAMCINOLONE ACETONIDE 55 MCG/ACT NA AERO
1.0000 | INHALATION_SPRAY | Freq: Two times a day (BID) | NASAL | 1 refills | Status: AC
  Filled 2024-03-23: qty 16.9, 30d supply, fill #0

## 2024-03-23 MED ORDER — BREZTRI AEROSPHERE 160-9-4.8 MCG/ACT IN AERO
2.0000 | INHALATION_SPRAY | Freq: Two times a day (BID) | RESPIRATORY_TRACT | 5 refills | Status: DC
  Filled 2024-03-23: qty 10.7, 30d supply, fill #0

## 2024-03-23 MED ORDER — METHYLPREDNISOLONE ACETATE 80 MG/ML IJ SUSP
80.0000 mg | Freq: Once | INTRAMUSCULAR | Status: AC
  Administered 2024-03-23: 80 mg via INTRAMUSCULAR

## 2024-03-23 MED ORDER — SPACER/AERO-HOLDING CHAMBERS DEVI
1.0000 | 1 refills | Status: DC
  Filled 2024-03-23: qty 1, 1d supply, fill #0
  Filled 2024-03-23: qty 1, 30d supply, fill #0

## 2024-03-23 MED ORDER — NEBULIZER MASK ADULT MISC
1.0000 | 2 refills | Status: AC
  Filled 2024-03-23: qty 1, 30d supply, fill #0

## 2024-03-23 MED ORDER — LEVOCETIRIZINE DIHYDROCHLORIDE 5 MG PO TABS
5.0000 mg | ORAL_TABLET | Freq: Every evening | ORAL | 5 refills | Status: DC
  Filled 2024-03-23: qty 30, 30d supply, fill #0

## 2024-03-23 MED ORDER — IPRATROPIUM-ALBUTEROL 0.5-2.5 (3) MG/3ML IN SOLN
3.0000 mL | Freq: Once | RESPIRATORY_TRACT | Status: AC
  Administered 2024-03-23: 3 mL via RESPIRATORY_TRACT

## 2024-03-23 MED ORDER — AIRSUPRA 90-80 MCG/ACT IN AERO
2.0000 | INHALATION_SPRAY | RESPIRATORY_TRACT | 5 refills | Status: DC | PRN
  Filled 2024-03-23: qty 10.7, 16d supply, fill #0

## 2024-03-23 MED ORDER — RYALTRIS 665-25 MCG/ACT NA SUSP: 2.0000 | Freq: Two times a day (BID) | NASAL | 1 refills | Status: DC

## 2024-03-23 MED ORDER — ALBUTEROL SULFATE (2.5 MG/3ML) 0.083% IN NEBU
2.5000 mg | INHALATION_SOLUTION | RESPIRATORY_TRACT | 1 refills | Status: AC | PRN
  Filled 2024-03-23: qty 150, 9d supply, fill #0

## 2024-03-23 NOTE — Progress Notes (Unsigned)
 [place] - [place] - [place] - [place] - [place]   Follow-up Note  Referring Provider: Dorthy Gavia, MD Primary Provider: Dorthy Gavia, MD Date of Office Visit: 03/23/2024  Subjective:   Sherry Edwards (DOB: Jan 03, 1971) is a 53 y.o. female who [facility] on 03/23/2024 in re-evaluation of the following:  HPI: Sherry Edwards presents to this clinic in evaluation of asthma and allergic rhinitis and chronic sinusitis.  I have never seen her in this clinic and her last visit with Dr. Lydia Sams was 29 August 2023.  She has wheezing and coughing and shortness of breath and nasal congestion and stuffiness and this appears to be a waxing and waning problem even in the face of utilizing anti-inflammatory agents for both her upper and lower airway.  She has had a very difficult time this past week.  She did have some bleach exposure that may have triggered this event.  She has documented chronic sinusitis from a CT scan obtained 12 August 2023 and it was recommended that she consider undergoing sinus surgery.  She cannot smell at all.  Allergies as of 03/23/2024   Not on File      Medication List    Airsupra  90-80 MCG/ACT Aero Generic drug: Albuterol -Budesonide  Inhale 2 puffs into the lungs as needed (maximum 12 puffs/day).   Breztri  Aerosphere 160-9-4.8 MCG/ACT Aero inhaler Generic drug: budeson-glycopyrrolate -formoterol  Inhale 2 puffs into the lungs in the morning and at bedtime.   levocetirizine 5 MG tablet Commonly known as: XYZAL  Take 1 tablet (5 mg total) by mouth every evening.   multivitamin capsule Take 1 capsule by mouth daily.   Ryaltris  665-25 MCG/ACT Susp Generic drug: Olopatadine -Mometasone  Place 2 sprays into the nose daily as needed.    Past Medical History:  Diagnosis Date   Asthma     Past Surgical History:  Procedure Laterality Date   HERNIA REPAIR      Review of systems negative except as noted in HPI / PMHx or  noted below:  Review of Systems  Constitutional: Negative.   HENT: Negative.    Eyes: Negative.   Respiratory: Negative.    Cardiovascular: Negative.   Gastrointestinal: Negative.   Genitourinary: Negative.   Musculoskeletal: Negative.   Skin: Negative.   Neurological: Negative.   Endo/Heme/Allergies: Negative.   Psychiatric/Behavioral: Negative.       Objective:   Vitals:   03/23/24 1641  BP: 126/76  Pulse: 73  Resp: 16  Temp: 98.1 F (36.7 C)  SpO2: 99%          Physical Exam Constitutional:      Appearance: She is not diaphoretic.  HENT:     Head: Normocephalic.     Right Ear: Tympanic membrane, ear canal and external ear normal.     Left Ear: Tympanic membrane, ear canal and external ear normal.     Nose: Mucosal edema present. No rhinorrhea.     Mouth/Throat:     Pharynx: Uvula midline. No oropharyngeal exudate.  Eyes:     Conjunctiva/sclera: Conjunctivae normal.  Neck:     Thyroid: No thyromegaly.     Trachea: Trachea normal. No tracheal tenderness or tracheal deviation.  Cardiovascular:     Rate and Rhythm: Normal rate and regular rhythm.     Heart sounds: Normal heart sounds, S1 normal and S2 normal. No murmur heard. Pulmonary:     Effort: No respiratory distress.     Breath sounds: No stridor. Wheezing (Bilateral expiratory wheezing throughout all lung  fields) present. No rales.  Lymphadenopathy:     Head:     Right side of head: No tonsillar adenopathy.     Left side of head: No tonsillar adenopathy.     Cervical: No cervical adenopathy.  Skin:    Findings: No erythema or rash.     Nails: There is no clubbing.  Neurological:     Mental Status: She is alert.     Diagnostics: Spirometry was performed and demonstrated an FEV1 of 1.40 at 68 % of predicted.  Assessment and Plan:   1. Not well controlled severe persistent asthma   2. Perennial allergic rhinitis   3. Seasonal allergic rhinitis due to pollen   4. Chronic pansinusitis   5.  History of nasal polyp    1. Duoneb delivered in clinic today  2. Depomedrol 80 IM delivered in clinic today   3. Continue Breztri  - 2 inhalations 2 times per day w/ spacer (empty lungs)  4. Start OTC Nasacort  - 1 sprays each nostril 2 times per day  5. Start Tezepelumab injections every 4 weeks when available - TAMMY  6. If needed:   A. Airsupra  - 2 inhalations every 6 hours  B. Nebulized albuterol  every 6 hours  C. Xyzal  5 mg - 1 tablet 1 time per day  7. Return to clinic in 4 weeks or earlier if problem  Maia has a very inflamed airway with severe asthma, allergic rhinitis, chronic sinusitis, history of nasal polyp and we will start her on tezepelumab as soon as possible and for today we have given her systemic steroid and she will consistently use anti-inflammatory agents for both her and upper and lower airway.  I will see her back in this clinic in 4 weeks or earlier if there is a problem with this plan.  Schuyler Custard, MD Allergy  / Immunology [facility]

## 2024-03-23 NOTE — Patient Instructions (Addendum)
  1. Duoneb delivered in clinic today  2. Depomedrol 80 IM delivered in clinic today   3. Continue Breztri  - 2 inhalations 2 times per day w/ spacer (empty lungs)  4. Start OTC Nasacort  - 1 sprays each nostril 2 times per day  5. Start Tezepelumab injections every 4 weeks when available - TAMMY  6. If needed:   A. Airsupra  - 2 inhalations every 6 hours  B. Nebulized albuterol  every 6 hours  C. Xyzal  5 mg - 1 tablet 1 time per day  7. Return to clinic in 4 weeks or earlier if problem

## 2024-03-24 ENCOUNTER — Other Ambulatory Visit (HOSPITAL_COMMUNITY): Payer: Self-pay

## 2024-03-24 ENCOUNTER — Telehealth: Payer: Self-pay | Admitting: *Deleted

## 2024-03-24 ENCOUNTER — Encounter: Payer: Self-pay | Admitting: Allergy and Immunology

## 2024-03-24 NOTE — Telephone Encounter (Signed)
 L/M for patient to contact me to advise approval, copay card and submit to [facility] for [employer].

## 2024-03-24 NOTE — Telephone Encounter (Signed)
-----   Message from ERIC J KOZLOW sent at 03/24/2024  7:08 AM EDT ----- Montine Apo for asthma

## 2024-03-26 ENCOUNTER — Other Ambulatory Visit (HOSPITAL_COMMUNITY): Payer: Self-pay

## 2024-03-31 ENCOUNTER — Other Ambulatory Visit (HOSPITAL_COMMUNITY): Payer: Self-pay

## 2024-03-31 MED ORDER — TEZSPIRE 210 MG/1.91ML ~~LOC~~ SOAJ
210.0000 mg | SUBCUTANEOUS | 11 refills | Status: DC
  Filled 2024-04-01: qty 1.91, fill #0

## 2024-03-31 NOTE — Telephone Encounter (Signed)
 T/c from patient advised approval and submit to [facility] will reach out to schedule start once delivery set

## 2024-04-01 ENCOUNTER — Other Ambulatory Visit (HOSPITAL_COMMUNITY): Payer: Self-pay

## 2024-04-01 ENCOUNTER — Other Ambulatory Visit: Payer: Self-pay

## 2024-04-01 ENCOUNTER — Encounter (HOSPITAL_COMMUNITY): Payer: Self-pay

## 2024-04-01 ENCOUNTER — Telehealth: Payer: Self-pay | Admitting: Pharmacist

## 2024-04-01 NOTE — Progress Notes (Signed)
 A user error has taken place: encounter opened in error, closed for administrative reasons.

## 2024-04-01 NOTE — Telephone Encounter (Signed)
 Called patient to schedule an appointment for the [facility] [employer] Special[facility]ication [facility]. I was unable to reach the patient so I left a HIPAA-compliant message requesting that the patient return my call.   Butch Penny, PharmD, [place]y [place], CPP Clinical Pharmacist [facility] [phone]

## 2024-04-01 NOTE — Progress Notes (Signed)
 Pharmacy Patient Advocate Encounter  Insurance verification completed.   The patient is insured through [facility]   Ran test claim for Tezspire. Co-pay is $0. Patient has copay card.  This test claim was processed through [facility]- copay amounts may vary at other pharmacies due to pharmacy/plan contracts, or as the patient moves through the different stages of their insurance plan.

## 2024-04-06 ENCOUNTER — Other Ambulatory Visit: Payer: Self-pay

## 2024-04-07 ENCOUNTER — Other Ambulatory Visit: Payer: Self-pay

## 2024-04-07 ENCOUNTER — Ambulatory Visit: Attending: Internal Medicine | Admitting: Pharmacist

## 2024-04-07 ENCOUNTER — Other Ambulatory Visit: Payer: Self-pay | Admitting: Pharmacist

## 2024-04-07 ENCOUNTER — Encounter (HOSPITAL_COMMUNITY): Payer: Self-pay

## 2024-04-07 ENCOUNTER — Encounter: Payer: Self-pay | Admitting: Pharmacist

## 2024-04-07 DIAGNOSIS — Z7189 Other specified counseling: Secondary | ICD-10-CM

## 2024-04-07 DIAGNOSIS — J4550 Severe persistent asthma, uncomplicated: Secondary | ICD-10-CM

## 2024-04-07 MED ORDER — TEZSPIRE 210 MG/1.91ML ~~LOC~~ SOAJ
210.0000 mg | SUBCUTANEOUS | 11 refills | Status: AC
  Filled 2024-04-07: qty 1.91, fill #0
  Filled 2024-04-12: qty 1.91, 28d supply, fill #0
  Filled 2024-05-03: qty 1.91, 28d supply, fill #1
  Filled 2024-06-04: qty 1.91, 28d supply, fill #2
  Filled 2024-06-30: qty 1.91, 28d supply, fill #3
  Filled 2024-08-03: qty 1.91, 28d supply, fill #4
  Filled 2024-08-25: qty 1.91, 28d supply, fill #5
  Filled 2024-09-20: qty 1.91, 28d supply, fill #6
  Filled 2024-10-21: qty 1.91, 28d supply, fill #7
  Filled 2024-11-16 – 2024-11-19 (×2): qty 1.91, 28d supply, fill #8
  Filled 2024-12-15: qty 1.91, 28d supply, fill #9

## 2024-04-07 NOTE — Progress Notes (Signed)
 HPI Patient presents today to [facility] to see pharmacy team for Tezspire new start. This is managed by Dr. Kozlow for asthma.  Adherence: has not yet started   Efficacy: has not yet started   Dose: 210 mg once every 28 days  Reported side effects: none, has not yet started  OBJECTIVE  Outpatient Encounter Medications as of 04/07/2024  Medication Sig   albuterol  (PROVENTIL ) (2.5 MG/3ML) 0.083% nebulizer solution Inhale 3 mLs (2.5 mg total) by nebulization every 4 (four) hours as needed for wheezing or shortness of breath.   Albuterol -Budesonide  (AIRSUPRA ) 90-80 MCG/ACT AERO Inhale 2 puffs into the lungs as needed (maximum 12 puffs/day).   budeson-glycopyrrolate -formoterol  (BREZTRI  AEROSPHERE) 160-9-4.8 MCG/ACT AERO inhaler Inhale 2 puffs into the lungs in the morning and at bedtime.   levocetirizine (XYZAL ) 5 MG tablet Take 1 tablet (5 mg total) by mouth every evening.   Multiple Vitamin (MULTIVITAMIN) capsule Take 1 capsule by mouth daily.   Olopatadine -Mometasone  (RYALTRIS ) 665-25 MCG/ACT SUSP Place 2 sprays into the nose in the morning and at bedtime.   Respiratory Therapy Supplies (NEBULIZER MASK ADULT) MISC Use with nebulizer as directed   Spacer/Aero-Holding Chambers DEVI Use as directed with inhaler   Tezepelumab-ekko (TEZSPIRE) 210 MG/1. SOAJ Inject 210 mg into the skin every 28 (twenty-eight) days.   triamcinolone  (NASACORT ) 55 MCG/ACT AERO nasal inhaler Place 1 spray into the nose 2 (two) times daily.   No facility-administered encounter medications on file as of 04/07/2024.     Immunization History  Administered Date(s) Administered   PFIZER(Purple Top)SARS-COV-2 Vaccination 02/03/2020, 02/28/2020   Tdap 06/05/2020    PFTs     No data to display           Assessment   Biologics training for tezepulumab Rosalind [place])  Goals of therapy: Mechanism: human monoclonal IgG2? antibody that binds to TSLP. This blocks TSLP from its effect on inflammation including  reduce eosinophils, IgE, FeNO, IL-5, and IL-13. Mechanism is not definitively established. Reviewed that Tezspire is add-on medication and patient must continue maintenance inhaler regimen. Response to therapy: may take 3-4 months to determine efficacy.  Side effects: injection site reaction (6-18%), antibody development (2%), arthralgia (4%), back pain (4%), pharyngitis (4%)  Dose: Tezspire 210 mg once every 4 weeks  Administration/Storage:  Reviewed administration sites of thigh or abdomen (at least 2-3 inches away from abdomen). Reviewed the upper arm is only appropriate if caregiver is administering injection  Do not shake pen/syringe as this could lead to product foaming or precipitation. Do not shake syringe as this could lead to product foaming or precipitation.  Access: Approval of Tezspire through: insurance   Medication Reconciliation  A drug regimen assessment was performed, including review of allergies, interactions, disease-state management, dosing and immunization history. Medications were reviewed with the patient, including name, instructions, indication, goals of therapy, potential side effects, importance of adherence, and safe use.  Drug interaction(s): no pertinent interactions identified.   Immunizations  Patient is indicated for the influenzae, pneumonia, and shingles vaccinations. Patient has received initial COVID19 vaccines in 2021.   PLAN Continue Tezspire 210mg  SQ every 28 days.  Rx sent to: [facility]: [phone] .    All questions encouraged and answered.  Instructed patient to reach out with any further questions or concerns.  Thank you for allowing pharmacy to participate in this patient's care.  This appointment required 30 minutes of patient care (this includes precharting, chart review, review of results, face-to-face care, etc.).   Van Gelinas  Freada Jacobs, PharmD, BCACP, CPP Clinical Pharmacist [facility] [phone]

## 2024-04-07 NOTE — Progress Notes (Signed)
 Please see OV from 04/07/24 for complete documentation.   Marene Shape, PharmD, Becky Bowels, CPP Clinical Pharmacist [facility] [phone]

## 2024-04-08 ENCOUNTER — Ambulatory Visit (HOSPITAL_COMMUNITY): Payer: Self-pay | Admitting: Licensed Clinical Social Worker

## 2024-04-12 ENCOUNTER — Other Ambulatory Visit: Payer: Self-pay

## 2024-04-12 NOTE — Progress Notes (Signed)
 Specialty Pharmacy Initial Fill Coordination Note  Sherry Edwards is a 53 y.o. female contacted today regarding initial fill of specialty medication(s) Tezepelumab -ekko (Tezspire )   Patient requested Courier to Provider Office   Delivery date: 04/14/24   [facility]   Medication will be filled on 6/3.   Patient is aware of $0 copayment.

## 2024-04-16 ENCOUNTER — Encounter: Payer: Self-pay | Admitting: *Deleted

## 2024-04-20 ENCOUNTER — Other Ambulatory Visit: Payer: Self-pay

## 2024-04-20 ENCOUNTER — Encounter: Payer: Self-pay | Admitting: Allergy and Immunology

## 2024-04-20 ENCOUNTER — Ambulatory Visit (INDEPENDENT_AMBULATORY_CARE_PROVIDER_SITE_OTHER): Admitting: Allergy and Immunology

## 2024-04-20 VITALS — BP 118/70 | HR 69 | Temp 97.8°F | Resp 16 | Ht 60.0 in | Wt 210.0 lb

## 2024-04-20 DIAGNOSIS — J4550 Severe persistent asthma, uncomplicated: Secondary | ICD-10-CM | POA: Diagnosis not present

## 2024-04-20 DIAGNOSIS — J324 Chronic pansinusitis: Secondary | ICD-10-CM | POA: Diagnosis not present

## 2024-04-20 DIAGNOSIS — Z8709 Personal history of other diseases of the respiratory system: Secondary | ICD-10-CM

## 2024-04-20 DIAGNOSIS — J3089 Other allergic rhinitis: Secondary | ICD-10-CM

## 2024-04-20 MED ORDER — TEZEPELUMAB-EKKO 210 MG/1.91ML ~~LOC~~ SOSY
210.0000 mg | PREFILLED_SYRINGE | Freq: Once | SUBCUTANEOUS | Status: AC
  Administered 2024-04-20: 210 mg via SUBCUTANEOUS

## 2024-04-20 NOTE — Patient Instructions (Addendum)
  1. Continue Breztri  - 2 inhalations 1-2 times per day w/ spacer (empty lungs)  2. Continue OTC Nasacort  - 1 sprays each nostril 1-2 times per day  3. Start Tezepelumab  injections today  4. If needed:   A. Airsupra  - 2 inhalations every 6 hours  B. Nebulized albuterol  every 6 hours  C. Xyzal  5 mg - 1 tablet 1 time per day  5. Return to clinic in 12 weeks or earlier if problem

## 2024-04-20 NOTE — Progress Notes (Unsigned)
 [place] - [place] - [place] - [place] - [place]   Follow-up Note  Referring Provider: Dorthy Gavia, MD Primary Provider: Marni Sins, MD Date of Office Visit: 04/20/2024  Subjective:   Sherry Edwards (DOB: [birth year]) is a 53 y.o. female who [facility] in re-evaluation of the following:  HPI: Laquilla returns to this clinic in evaluation of severe asthma, allergic rhinitis, chronic sinusitis.  I last saw her in this clinic 23 Mar 2024 at which point in time she received a systemic steroid for uncontrolled asthma.  She is doing much better at this point in time since she has received her systemic steroid and she rarely needs to use the short acting bronchodilator at this point while she continues on Breztri  on a consistent basis.  And she can now smell which is a significant improvement given the fact that she could not smell food at all before her last evaluation and treatment.  Allergies as of 04/20/2024   Not on File      Medication List    Airsupra  90-80 MCG/ACT Aero Generic drug: Albuterol -Budesonide  Inhale 2 puffs into the lungs as needed (maximum 12 puffs/day).   albuterol  (2.5 MG/3ML) 0.083% nebulizer solution Commonly known as: PROVENTIL  Inhale 3 mLs (2.5 mg total) by nebulization every 4 (four) hours as needed for wheezing or shortness of breath.   Breztri  Aerosphere 160-9-4.8 MCG/ACT Aero inhaler Generic drug: budesonide -glycopyrrolate -formoterol  Inhale 2 puffs into the lungs in the morning and at bedtime.   levocetirizine 5 MG tablet Commonly known as: XYZAL  Take 1 tablet (5 mg total) by mouth every evening.   multivitamin capsule Take 1 capsule by mouth daily.   Nebulizer Mask Adult Misc Use with nebulizer as directed   Ryaltris  665-25 MCG/ACT Susp Generic drug: Olopatadine -Mometasone  Place 2 sprays into the nose in the morning and at bedtime.   Spacer/Aero-Holding [employer] Use as  directed with inhaler   Tezspire  210 MG/1. Soaj Generic drug: Tezepelumab -ekko Inject 210 mg into the skin every 28 (twenty-eight) days.   triamcinolone  55 MCG/ACT Aero nasal inhaler Commonly known as: NASACORT  Place 1 spray into the nose 2 (two) times daily.    Past Medical History:  Diagnosis Date   Asthma     Past Surgical History:  Procedure Laterality Date   HERNIA REPAIR      Review of systems negative except as noted in HPI / PMHx or noted below:  Review of Systems  Constitutional: Negative.   HENT: Negative.    Eyes: Negative.   Respiratory: Negative.    Cardiovascular: Negative.   Gastrointestinal: Negative.   Genitourinary: Negative.   Musculoskeletal: Negative.   Skin: Negative.   Neurological: Negative.   Endo/Heme/Allergies: Negative.   Psychiatric/Behavioral: Negative.       Objective:   Vitals:   04/20/24 0949  BP: 118/70  Pulse: 69  Resp: 16  Temp: 97.8 F (36.6 C)  SpO2: 98%   Height: 5' (152.4 cm)  Weight: 210 lb (95.3 kg)   Physical Exam Constitutional:      Appearance: She is not diaphoretic.  HENT:     Head: Normocephalic.     Right Ear: Tympanic membrane, ear canal and external ear normal.     Left Ear: Tympanic membrane, ear canal and external ear normal.     Nose: Nose normal. No mucosal edema or rhinorrhea.     Mouth/Throat:     Pharynx: Uvula midline. No oropharyngeal exudate.  Eyes:  Conjunctiva/sclera: Conjunctivae normal.  Neck:     Thyroid: No thyromegaly.     Trachea: Trachea normal. No tracheal tenderness or tracheal deviation.  Cardiovascular:     Rate and Rhythm: Normal rate and regular rhythm.     Heart sounds: Normal heart sounds, S1 normal and S2 normal. No murmur heard. Pulmonary:     Effort: No respiratory distress.     Breath sounds: Normal breath sounds. No stridor. No wheezing or rales.  Lymphadenopathy:     Head:     Right side of head: No tonsillar adenopathy.     Left side of head: No  tonsillar adenopathy.     Cervical: No cervical adenopathy.  Skin:    Findings: No erythema or rash.     Nails: There is no clubbing.  Neurological:     Mental Status: She is alert.     Diagnostics:    Spirometry was performed and demonstrated an FEV1 of 1.74 at 84 % of predicted.  Results of a sinus CT scan obtained 25 July 2023 identified the following:  Paranasal sinuses:  Frontal: Widespread mucosal thickening, worse on the left than the  right. No layering fluid.  Ethmoid: Mucosal thickening throughout the ethmoid regions with  numerous opacified air cells.  Maxillary: Circumferential mucosal thickening of both maxillary  sinuses.  Sphenoid: Mild circumferential mucosal thickening. No layering  fluid.  Right ostiomeatal unit: Obscured by regional mucosal thickening. Left ostiomeatal unit: Obscured by regional mucosal thickening.  Nasal passages: Upper nasal passages are occluded due to mucosal  thickening. Intact nasal septum is midline.  Anatomy: No pneumatization superior to anterior ethmoid notches.  Symmetric and intact olfactory grooves and fovea ethmoidalis, Keros  I (1-75mm). Sellar sphenoid pneumatization pattern. No dehiscence of  carotid or optic canals. No onodi cell.    Assessment and Plan:   1. Not well controlled severe persistent asthma   2. Perennial allergic rhinitis   3. Chronic pansinusitis   4. History of nasal polyp    1. Continue Breztri  - 2 inhalations 1-2 times per day w/ spacer (empty lungs)  2. Continue OTC Nasacort  - 1 sprays each nostril 1-2 times per day  3. Start Tezepelumab  injections today  4. If needed:   A. Airsupra  - 2 inhalations every 6 hours  B. Nebulized albuterol  every 6 hours  C. Xyzal  5 mg - 1 tablet 1 time per day  5. Return to clinic in 12 weeks or earlier if problem  Janashia appears to be doing better which would be the expected response when receiving a systemic steroid.  Will now start her on anti-TSLP  antibody as a long-term approach to her severe asthma, chronic sinusitis, nasal polyposis.  I did caution her about taking any nonsteroidal anti-inflammatory drugs.  Although she has never had a reaction to nonsteroidal anti-inflammatory drugs there has been almost 10 years without the use of these medications as she was cautioned about using these medications 10 years ago.  Schuyler Custard, MD Allergy  / Immunology [facility]

## 2024-04-21 ENCOUNTER — Encounter: Payer: Self-pay | Admitting: Allergy and Immunology

## 2024-04-23 NOTE — Addendum Note (Signed)
 Addended by: Lesley Galentine E on: 04/23/2024 04:00 PM   Modules accepted: Orders

## 2024-04-26 ENCOUNTER — Other Ambulatory Visit: Payer: Self-pay

## 2024-04-26 ENCOUNTER — Other Ambulatory Visit: Payer: Self-pay | Admitting: Student

## 2024-04-26 DIAGNOSIS — Z1231 Encounter for screening mammogram for malignant neoplasm of breast: Secondary | ICD-10-CM

## 2024-04-30 ENCOUNTER — Other Ambulatory Visit: Payer: Self-pay

## 2024-05-03 ENCOUNTER — Other Ambulatory Visit: Payer: Self-pay

## 2024-05-03 NOTE — Progress Notes (Signed)
 Specialty Pharmacy Refill Coordination Note  Niketa Tavano is a 53 y.o. female assessed today regarding refills of clinic administered specialty medication(s) Tezepelumab -ekko (Tezspire )   Clinic requested Courier to Provider Office   Delivery date: 05/11/24  Injection date: 05/18/24   [facility]   Medication will be filled on 05/10/24.

## 2024-05-07 ENCOUNTER — Encounter

## 2024-05-07 DIAGNOSIS — Z1231 Encounter for screening mammogram for malignant neoplasm of breast: Secondary | ICD-10-CM

## 2024-05-10 ENCOUNTER — Other Ambulatory Visit: Payer: Self-pay

## 2024-05-18 ENCOUNTER — Ambulatory Visit

## 2024-05-18 DIAGNOSIS — J4550 Severe persistent asthma, uncomplicated: Secondary | ICD-10-CM | POA: Diagnosis not present

## 2024-05-18 MED ORDER — TEZEPELUMAB-EKKO 210 MG/1.91ML ~~LOC~~ SOSY
210.0000 mg | PREFILLED_SYRINGE | Freq: Once | SUBCUTANEOUS | Status: AC
  Administered 2024-04-20 – 2024-05-18 (×2): 210 mg via SUBCUTANEOUS

## 2024-05-19 ENCOUNTER — Ambulatory Visit

## 2024-05-28 ENCOUNTER — Ambulatory Visit
Admission: RE | Admit: 2024-05-28 | Discharge: 2024-05-28 | Disposition: A | Discharge status: Home / Self Care | Source: Ambulatory Visit

## 2024-05-28 DIAGNOSIS — Z1231 Encounter for screening mammogram for malignant neoplasm of breast: Secondary | ICD-10-CM | POA: Diagnosis not present

## 2024-06-04 ENCOUNTER — Other Ambulatory Visit (HOSPITAL_COMMUNITY): Payer: Self-pay

## 2024-06-04 ENCOUNTER — Other Ambulatory Visit: Payer: Self-pay

## 2024-06-04 NOTE — Progress Notes (Signed)
 Specialty Pharmacy Refill Coordination Note  Spoke with Sherry Edwards is a 53 y.o. female contacted today regarding refills of specialty medication(s) Tezepelumab -ekko (Tezspire )  Doses on hand: 0   Injection appointment: 06/15/24  Patient requested: Courier to Provider Office   Delivery date: 06/08/24   [facility] [place] [address]  Medication will be filled on 06/07/24.

## 2024-06-07 ENCOUNTER — Other Ambulatory Visit: Payer: Self-pay

## 2024-06-11 ENCOUNTER — Ambulatory Visit: Payer: Self-pay | Admitting: Student

## 2024-06-11 ENCOUNTER — Encounter: Payer: Self-pay | Admitting: Student

## 2024-06-11 VITALS — BP 119/65 | HR 60 | Temp 98.1°F | Ht 60.0 in | Wt 204.8 lb

## 2024-06-11 DIAGNOSIS — Z0000 Encounter for general adult medical examination without abnormal findings: Secondary | ICD-10-CM | POA: Diagnosis not present

## 2024-06-11 DIAGNOSIS — R7303 Prediabetes: Secondary | ICD-10-CM

## 2024-06-11 NOTE — Progress Notes (Signed)
 Patient ID: Sherry Edwards, female   DOB: 1971-01-18, 53 y.o.   MRN: [id number]  Internal Medicine Clinic Attending  Case discussed with the resident at the time of the visit.  We reviewed the resident's history and exam and pertinent patient test results.  I agree with the assessment, diagnosis, and plan of care documented in the resident's note.

## 2024-06-11 NOTE — Assessment & Plan Note (Signed)
 1 year ago, A1c 5.9.  Denies polyuria or polydipsia. - Check A1c and lipids.

## 2024-06-11 NOTE — Patient Instructions (Addendum)
 It was a pleasure taking care of you today!    1.  Your annual exam was normal.  Continue to eat healthy, and I will strongly recommend increasing physical activity.  2.  Will recommend the pneumococcal vaccine, please check with your asthma specialist to see what will be the best time to take this.  3.  Recommend annual flu vaccine.  I have ordered the following labs for you:  Lab Orders         CBC         BMP8         Lipid Profile         POC Hbg A1C       Follow up: 1 year   Should you have any questions or concerns please call the internal medicine clinic at [phone].     Missy Sandhoff, MD  [facility]

## 2024-06-11 NOTE — Assessment & Plan Note (Signed)
 Benign physical exam.  Will do CBC, BMP

## 2024-06-11 NOTE — Progress Notes (Signed)
   CC: Annual physical  HPI:  [name] is a 53 y.o. female living with a history stated below and presents today for follow-up physical exam..   She is doing well.  She is on Tezpire monthly injection for asthma, no recent asthma flare. Denies depressed mood.  Weight has stayed fairly stable.  Not walking as much due to the hot weather.    Please see problem based assessment and plan for additional details.  Past Medical History:  Diagnosis Date   Asthma     Current Outpatient Medications on File Prior to Visit  Medication Sig Dispense Refill   albuterol  (PROVENTIL ) (2.5 MG/3ML) 0.083% nebulizer solution Inhale 3 mLs (2.5 mg total) by nebulization every 4 (four) hours as needed for wheezing or shortness of breath. 150 mL 1   Albuterol -Budesonide  (AIRSUPRA ) 90-80 MCG/ACT AERO Inhale 2 puffs into the lungs as needed (maximum 12 puffs/day). 10.7 g 5   budeson-glycopyrrolate -formoterol  (BREZTRI  AEROSPHERE) 160-9-4.8 MCG/ACT AERO inhaler Inhale 2 puffs into the lungs in the morning and at bedtime. 10.7 g 5   levocetirizine (XYZAL ) 5 MG tablet Take 1 tablet (5 mg total) by mouth every evening. 30 tablet 5   Multiple Vitamin (MULTIVITAMIN) capsule Take 1 capsule by mouth daily.     Olopatadine -Mometasone  (RYALTRIS ) 665-25 MCG/ACT SUSP Place 2 sprays into the nose in the morning and at bedtime. 87 g 1   Respiratory Therapy Supplies (NEBULIZER MASK ADULT) MISC Use with nebulizer as directed 1 each 2   Spacer/Aero-Holding Chambers DEVI Use as directed with inhaler 1 each 1   Tezepelumab -ekko (TEZSPIRE ) 210 MG/1. SOAJ Inject 210 mg into the skin every 28 (twenty-eight) days. 1.91 mL 11   triamcinolone  (NASACORT ) 55 MCG/ACT AERO nasal inhaler Place 1 spray into the nose 2 (two) times daily. 50.7 mL 1   No current facility-administered medications on file prior to visit.    Review of Systems: ROS negative except for what is noted on the assessment and plan.  Vitals:   06/11/24  0840  BP: 119/65  Pulse: 60  Temp: 98.1 F (36.7 C)  TempSrc: Oral  SpO2: 99%  Weight: 204 lb 12.8 oz (92.9 kg)  Height: 5' (1.524 m)       Physical Exam: Constitutional: NAD Cardiovascular: RRR, no murmurs. Pulmonary/Chest: Clear bilateral lungs no wheezes. Abdominal: soft, non-tender, non-distended. Skin: Warm and dry no edema. Neuro: No focal weakness.  Assessment & Plan:   Patient discussed with Dr. Shawn Sick  Assessment & Plan Encounter for annual physical exam Benign physical exam.  Will do CBC, BMP Prediabetes 1 year ago, A1c 5.9.  Denies polyuria or polydipsia. - Check A1c and lipids.  Orders Placed This Encounter  Procedures   CBC   BMP8   Lipid Profile   POC Hbg A1C     Missy Sandhoff, MD [facility] Internal Medicine, PGY-2  Date 06/11/2024 Time 9:14 AM

## 2024-06-12 LAB — BMP8
BUN: 14 mg/dL (ref 6–24)
CO2: 19 mmol/L — ABNORMAL LOW (ref 20–29)
Calcium: 8.9 mg/dL (ref 8.7–10.2)
Chloride: 106 mmol/L (ref 96–106)
Creatinine, Ser: 0.94 mg/dL (ref 0.57–1.00)
Glucose: 93 mg/dL (ref 70–99)
Potassium: 4.8 mmol/L (ref 3.5–5.2)
Sodium: 138 mmol/L (ref 134–144)
eGFR: 73 mL/min/1.73 (ref >59)

## 2024-06-12 LAB — HEMOGLOBIN A1C
Est. average glucose Bld gHb Est-mCnc: 126 mg/dL
Hgb A1c MFr Bld: 6 % — ABNORMAL HIGH (ref 4.8–5.6)

## 2024-06-12 LAB — LIPID PANEL
Chol/HDL Ratio: 2.8 ratio (ref 0.0–4.4)
Cholesterol, Total: 180 mg/dL (ref 100–199)
HDL: 65 mg/dL (ref >39)
LDL Chol Calc (NIH): 106 mg/dL — ABNORMAL HIGH (ref 0–99)
Triglycerides: 43 mg/dL (ref 0–149)
VLDL Cholesterol Cal: 9 mg/dL (ref 5–40)

## 2024-06-12 LAB — CBC
Hematocrit: 43 % (ref 34.0–46.6)
Hemoglobin: 12.4 g/dL (ref 11.1–15.9)
MCH: 21.9 pg — ABNORMAL LOW (ref 26.6–33.0)
MCHC: 28.8 g/dL — ABNORMAL LOW (ref 31.5–35.7)
MCV: 76 fL — ABNORMAL LOW (ref 79–97)
Platelets: 197 x10E3/uL (ref 150–450)
RBC: 5.66 x10E6/uL — ABNORMAL HIGH (ref 3.77–5.28)
RDW: 15 % (ref 11.7–15.4)
WBC: 4.9 x10E3/uL (ref 3.4–10.8)

## 2024-06-15 ENCOUNTER — Ambulatory Visit

## 2024-06-15 ENCOUNTER — Ambulatory Visit: Payer: Self-pay | Admitting: Student

## 2024-06-15 NOTE — Progress Notes (Signed)
 CBC, and BMP normal.

## 2024-06-15 NOTE — Progress Notes (Signed)
 A1c 6%,LDL 121>>> 106, still above goal. Encouraged healthy diet and exercise.

## 2024-06-16 ENCOUNTER — Other Ambulatory Visit: Payer: Self-pay | Admitting: Podiatry

## 2024-06-16 ENCOUNTER — Encounter: Payer: Self-pay | Admitting: Lab

## 2024-06-16 ENCOUNTER — Encounter: Payer: Self-pay | Admitting: Podiatry

## 2024-06-16 ENCOUNTER — Ambulatory Visit

## 2024-06-16 DIAGNOSIS — J4550 Severe persistent asthma, uncomplicated: Secondary | ICD-10-CM | POA: Diagnosis not present

## 2024-06-16 DIAGNOSIS — M722 Plantar fascial fibromatosis: Secondary | ICD-10-CM

## 2024-06-16 DIAGNOSIS — M79671 Pain in right foot: Secondary | ICD-10-CM

## 2024-06-16 MED ORDER — TEZEPELUMAB-EKKO 210 MG/1.91ML ~~LOC~~ SOSY
210.0000 mg | PREFILLED_SYRINGE | SUBCUTANEOUS | Status: DC
  Administered 2024-06-16: 210 mg via SUBCUTANEOUS

## 2024-06-16 MED ORDER — TEZEPELUMAB-EKKO 210 MG/1.91ML ~~LOC~~ SOSY
210.0000 mg | PREFILLED_SYRINGE | SUBCUTANEOUS | Status: AC
  Administered 2024-04-20: 210 mg via SUBCUTANEOUS

## 2024-06-16 MED ORDER — TEZEPELUMAB-EKKO 210 MG/1.91ML ~~LOC~~ SOSY
210.0000 mg | PREFILLED_SYRINGE | SUBCUTANEOUS | Status: AC
  Administered 2024-06-16 – 2024-12-02 (×7): 210 mg via SUBCUTANEOUS

## 2024-06-16 NOTE — Addendum Note (Signed)
 Addended by: MARCINE ISAIAH CROME on: 06/16/2024 11:49 AM   Modules accepted: Orders

## 2024-06-16 NOTE — Progress Notes (Signed)
 Order placed for physical therapy for the chronic plantar fasciitis.  Patient had called requesting it.

## 2024-06-17 NOTE — Telephone Encounter (Signed)
 Thank you :)

## 2024-06-30 ENCOUNTER — Other Ambulatory Visit: Payer: Self-pay | Admitting: Pharmacy Technician

## 2024-06-30 ENCOUNTER — Other Ambulatory Visit: Payer: Self-pay

## 2024-06-30 NOTE — Progress Notes (Signed)
 Specialty Pharmacy Refill Coordination Note  Tacora Wambolt is a 53 y.o. female assessed today regarding refills of clinic administered specialty medication(s) Tezepelumab -ekko (TEZSPIRE )  Clinic requested Courier to Provider Office   Delivery date: 07/08/24   Verified address[facility] [address] [place], [place] [address]  Medication will be filled on 07/07/24.   Appt on 9/3 @ 11:30 am. $0.00 Copay

## 2024-07-13 ENCOUNTER — Other Ambulatory Visit (HOSPITAL_COMMUNITY): Payer: Self-pay

## 2024-07-13 ENCOUNTER — Ambulatory Visit: Admitting: Allergy and Immunology

## 2024-07-13 VITALS — BP 122/86 | HR 65 | Temp 98.1°F | Resp 16 | Ht 60.0 in | Wt 205.0 lb

## 2024-07-13 DIAGNOSIS — R430 Anosmia: Secondary | ICD-10-CM

## 2024-07-13 DIAGNOSIS — J4550 Severe persistent asthma, uncomplicated: Secondary | ICD-10-CM | POA: Diagnosis not present

## 2024-07-13 DIAGNOSIS — Z6841 Body Mass Index (BMI) 40.0 and over, adult: Secondary | ICD-10-CM

## 2024-07-13 DIAGNOSIS — E66813 Obesity, class 3: Secondary | ICD-10-CM

## 2024-07-13 DIAGNOSIS — J301 Allergic rhinitis due to pollen: Secondary | ICD-10-CM | POA: Diagnosis not present

## 2024-07-13 DIAGNOSIS — J3089 Other allergic rhinitis: Secondary | ICD-10-CM | POA: Diagnosis not present

## 2024-07-13 DIAGNOSIS — R7309 Other abnormal glucose: Secondary | ICD-10-CM

## 2024-07-13 MED ORDER — AIRSUPRA 90-80 MCG/ACT IN AERO
2.0000 | INHALATION_SPRAY | RESPIRATORY_TRACT | 1 refills | Status: AC | PRN
Start: 2024-07-13 — End: ?
  Filled 2024-07-13: qty 10.7, 16d supply, fill #0

## 2024-07-13 MED ORDER — BREZTRI AEROSPHERE 160-9-4.8 MCG/ACT IN AERO
2.0000 | INHALATION_SPRAY | Freq: Two times a day (BID) | RESPIRATORY_TRACT | 1 refills | Status: AC
  Filled 2024-07-13: qty 32.1, 90d supply, fill #0

## 2024-07-13 MED ORDER — SPACER/AERO-HOLDING CHAMBERS DEVI
1.0000 | 1 refills | Status: AC
  Filled 2024-07-13: qty 1, 30d supply, fill #0

## 2024-07-13 MED ORDER — LEVOCETIRIZINE DIHYDROCHLORIDE 5 MG PO TABS
5.0000 mg | ORAL_TABLET | Freq: Every day | ORAL | 1 refills | Status: AC | PRN
Start: 2024-07-13 — End: ?
  Filled 2024-07-13: qty 90, 90d supply, fill #0

## 2024-07-13 NOTE — Patient Instructions (Addendum)
  1. Continue Breztri  - 2 inhalations 2 times per day w/ spacer (empty lungs)  2. Continue OTC Nasacort  - 1 sprays each nostril 1-2 times per day  3. Continue Tezepelumab  injections every 4 weeks  4. If needed:   A. Airsupra  - 2 inhalations every 6 hours  B. Nebulized albuterol  every 6 hours  C. Xyzal  5 mg - 1 tablet 1 time per day  5. Discuss with primary doctor about starting Mounjaro for hemoglobin A1c of 6.0% which would help with body weight, plantar fasciitis, and allow for more exercise.  6. Discuss with primary doctor about checking thyroid function  7. Plan for fall flu vaccine  8. Return to clinic in 6 months or earlier if problem

## 2024-07-13 NOTE — Progress Notes (Unsigned)
 [place] [facility] [place] - [place] - [place] - [place]   Follow-up Note  Referring Provider: Celestina Czar, MD Primary Provider: Celestina Czar, MD Date of Office Visit: 07/13/2024  Subjective:   Sherry Edwards (DOB: 09-12-1971) is a 53 y.o. female who [facility] in re-evaluation of the following:  HPI: Sherry Edwards returns to this clinic in evaluation of severe asthma, allergic rhinitis, chronic sinusitis.  I last saw her in this clinic 20 April 2024.  She thinks that her asthma is improved but it is not 100%.  She still has intermittent wheezing and she still must use a short acting bronchodilator twice a week.  She continues on Breztri  twice a day and tezepelumab  injections every 4 weeks.  She thinks that her nose still has problems.  She still has intermittent anosmia.  She has been using her nasal spray about every other day.  She has gained weight.  She cannot exercise very well because of plantar fasciitis.  She tells me that she has an elevated hemoglobin A1c.  Allergies as of 07/13/2024   No Known Allergies      Medication List    Airsupra  90-80 MCG/ACT Aero Generic drug: Albuterol -Budesonide  Inhale 2 puffs into the lungs as needed (maximum 12 puffs/day).   albuterol  (2.5 MG/3ML) 0.083% nebulizer solution Commonly known as: PROVENTIL  Inhale 3 mLs (2.5 mg total) by nebulization every 4 (four) hours as needed for wheezing or shortness of breath.   Breztri  Aerosphere 160-9-4.8 MCG/ACT Aero inhaler Generic drug: budesonide -glycopyrrolate -formoterol  Inhale 2 puffs into the lungs in the morning and at bedtime.   levocetirizine 5 MG tablet Commonly known as: XYZAL  Take 1 tablet (5 mg total) by mouth every evening.   multivitamin capsule Take 1 capsule by mouth daily.   Nebulizer Mask Adult Misc Use with nebulizer as directed   Spacer/Aero-Holding [employer] Use as directed with inhaler   Tezspire  210 MG/1.  Soaj Generic drug: Tezepelumab -ekko Inject 210 mg into the skin every 28 (twenty-eight) days.   triamcinolone  55 MCG/ACT Aero nasal inhaler Commonly known as: NASACORT  Place 1 spray into the nose 2 (two) times daily.    Past Medical History:  Diagnosis Date   Asthma     Past Surgical History:  Procedure Laterality Date   HERNIA REPAIR      Review of systems negative except as noted in HPI / PMHx or noted below:  Review of Systems  Constitutional: Negative.   HENT: Negative.    Eyes: Negative.   Respiratory: Negative.    Cardiovascular: Negative.   Gastrointestinal: Negative.   Genitourinary: Negative.   Musculoskeletal: Negative.   Skin: Negative.   Neurological: Negative.   Endo/Heme/Allergies: Negative.   Psychiatric/Behavioral: Negative.       Objective:   Vitals:   07/13/24 0822  BP: 122/86  Pulse: 65  Resp: 16  Temp: 98.1 F (36.7 C)  SpO2: 98%   Height: 5' (152.4 cm)  Weight: 205 lb (93 kg)   Physical Exam Constitutional:      Appearance: She is not diaphoretic.  HENT:     Head: Normocephalic.     Right Ear: Tympanic membrane, ear canal and external ear normal.     Left Ear: Tympanic membrane, ear canal and external ear normal.     Nose: Nose normal. No mucosal edema or rhinorrhea.     Mouth/Throat:     Pharynx: Uvula midline. No oropharyngeal exudate.  Eyes:     Conjunctiva/sclera: Conjunctivae normal.  Neck:     Thyroid: No thyromegaly.     Trachea: Trachea normal. No tracheal tenderness or tracheal deviation.  Cardiovascular:     Rate and Rhythm: Normal rate and regular rhythm.     Heart sounds: Normal heart sounds, S1 normal and S2 normal. No murmur heard. Pulmonary:     Effort: No respiratory distress.     Breath sounds: Normal breath sounds. No stridor. No wheezing or rales.  Lymphadenopathy:     Head:     Right side of head: No tonsillar adenopathy.     Left side of head: No tonsillar adenopathy.     Cervical: No cervical  adenopathy.  Skin:    Findings: No erythema or rash.     Nails: There is no clubbing.  Neurological:     Mental Status: She is alert.     Diagnostics: Spirometry was performed and demonstrated an FEV1 of 1.79 at 87 % of predicted.  Assessment and Plan:   1. Asthma, severe persistent, well-controlled   2. Perennial allergic rhinitis   3. Seasonal allergic rhinitis due to pollen   4. Anosmia   5. Class 3 severe obesity due to excess calories with serious comorbidity and body mass index (BMI) of 40.0 to 44.9 in adult   6. Elevated hemoglobin A1c    1. Continue Breztri  - 2 inhalations 2 times per day w/ spacer (empty lungs)  2. Continue OTC Nasacort  - 1 sprays each nostril 1-2 times per day  3. Continue Tezepelumab  injections every 4 weeks  4. If needed:   A. Airsupra  - 2 inhalations every 6 hours  B. Nebulized albuterol  every 6 hours  C. Xyzal  5 mg - 1 tablet 1 time per day  5. Discuss with primary doctor about starting Mounjaro for hemoglobin A1c of 6.0% which would help with body weight, plantar fasciitis, and allow for more exercise.  6. Discuss with primary doctor about checking thyroid function  7. Plan for fall flu vaccine  8. Return to clinic in 6 months or earlier if problem  Sherry Edwards is doing better regarding her asthma and I think the 1 nonpharmacological manipulation she can perform that will help her regarding better control of her asthma is undergoing exercise program.  But she is limited in undergoing an exercise program because she is obese and she has plantar fasciitis.  Because of her hemoglobin A1c of 6.0 I suspect she would qualify for Mounjaro injections to help with decreasing her body weight which should help with her plantar fasciitis which should allow her to exercise more.  And I cannot find a TSH or other evaluation of thyroid function in her chart and she needs to discuss this with her primary care doctor.  She will continue on a collection of  anti-inflammatory agents for airway including use of anti-TSLP antibody as noted above.  I will see her back in this clinic in 6 months or earlier if there is a problem.  Sherry Denis, MD Allergy  / Immunology [facility]

## 2024-07-14 ENCOUNTER — Ambulatory Visit

## 2024-07-14 ENCOUNTER — Encounter: Payer: Self-pay | Admitting: Allergy and Immunology

## 2024-07-19 ENCOUNTER — Other Ambulatory Visit (HOSPITAL_COMMUNITY): Payer: Self-pay

## 2024-07-27 ENCOUNTER — Ambulatory Visit: Payer: Self-pay

## 2024-07-27 NOTE — Telephone Encounter (Signed)
 Copied from CRM [id number]. Topic: Clinical - Lab/Test Results >> Jul 27, 2024 11:04 AM Alfonso ORN wrote: Reason for CRM: patient requesting to go over her last test results , Reason for Disposition . [1] Other NON-URGENT information for PCP AND [2] does not require PCP response  Answer Assessment - Initial Assessment Questions 1. REASON FOR CALL or QUESTION: What is your reason for calling today? or How can I best     Given lab results from 06/11/24. Verbalizes understanding. 2. CALLER: Document the source of call. (e.g., laboratory staff, caregiver or patient).     Patient.  Protocols used: PCP Call - No Triage-A-AH

## 2024-07-28 ENCOUNTER — Other Ambulatory Visit: Payer: Self-pay

## 2024-08-03 ENCOUNTER — Other Ambulatory Visit: Payer: Self-pay

## 2024-08-03 NOTE — Progress Notes (Signed)
 Specialty Pharmacy Refill Coordination Note  Sherry Edwards is a 53 y.o. female assessed today regarding refills of clinic administered specialty medication(s) Tezepelumab -ekko (TEZSPIRE )   Clinic requested Courier to Provider Office   Delivery date: 08/05/24   Verified address: [facility] - [address] [place], [place] [address]   Medication will be filled on 09.25.25.    Copay: $0.00 Appointment: 09.30.25

## 2024-08-04 ENCOUNTER — Telehealth (INDEPENDENT_AMBULATORY_CARE_PROVIDER_SITE_OTHER)

## 2024-08-04 DIAGNOSIS — Z713 Dietary counseling and surveillance: Secondary | ICD-10-CM

## 2024-08-04 DIAGNOSIS — E669 Obesity, unspecified: Secondary | ICD-10-CM | POA: Diagnosis not present

## 2024-08-04 NOTE — Progress Notes (Signed)
 Virtual Visit via Video Note  I connected with Sherry Edwards on 08/04/24 at  3:00 PM EDT by a video enabled telemedicine application and verified that I am speaking with the correct person using two identifiers.  Sherry Location: Home Provider Location: Office/Clinic  I discussed the limitations, risks, security, and privacy concerns of performing an evaluation and management service by video and the availability of in person appointments. I also discussed with the Sherry that there may be a Sherry responsible charge related to this service. The Sherry expressed understanding and agreed to proceed.  Subjective: PCP: Sherry Czar, MD  No chief complaint on file.  HPI Sherry Edwards is a 53 year old female with past medical history of asthma and prediabetes that presents for questions about weight loss management.  Last A1c on 06/11/2024 was 6.  From 04/20/2024 to 07/13/2024 Sherry has lost about 5 pounds.  Sherry states that her asthma and allergy  doctor recommended weight loss with these medications as it would help her asthma as well.  Sherry says that up until this point her weight loss has been due to dietary changes and an exercise as she can.  Sherry denies high blood pressure, stroke, hyperthyroidism, arrhythmias.  Sherry denies any symptoms like hair loss or fatigue out of normal. Sherry endorses snoring,  but denies waking up gasping for breath or stopping breathing.  She has never had a sleep study before.   ROS: Per HPI  Current Outpatient Medications:    albuterol  (PROVENTIL ) (2.5 MG/3ML) 0.083% nebulizer solution, Inhale 3 mLs (2.5 mg total) by nebulization every 4 (four) hours as needed for wheezing or shortness of breath., Disp: 150 mL, Rfl: 1   Albuterol -Budesonide  (AIRSUPRA ) 90-80 MCG/ACT AERO, Inhale 2 puffs into the lungs as needed (maximum 12 puffs/day)., Disp: 10.7 g, Rfl: 1   budesonide -glycopyrrolate -formoterol  (BREZTRI  AEROSPHERE) 160-9-4.8 MCG/ACT AERO  inhaler, Inhale 2 puffs into the lungs in the morning and at bedtime., Disp: 32.1 g, Rfl: 1   levocetirizine (XYZAL ) 5 MG tablet, Take 1 tablet (5 mg total) by mouth daily as needed for allergies (Can take an extra dose during flare ups.)., Disp: 180 tablet, Rfl: 1   Multiple Vitamin (MULTIVITAMIN) capsule, Take 1 capsule by mouth daily., Disp: , Rfl:    Respiratory Therapy Supplies (NEBULIZER MASK ADULT) MISC, Use with nebulizer as directed, Disp: 1 each, Rfl: 2   Spacer/Aero-Holding [employer] DEVI, Use as directed with inhaler, Disp: 1 each, Rfl: 1   Tezepelumab -ekko (TEZSPIRE ) 210 MG/1. SOAJ, Inject 210 mg into the skin every 28 (twenty-eight) days., Disp: 1.91 mL, Rfl: 11   triamcinolone  (NASACORT ) 55 MCG/ACT AERO nasal inhaler, Place 1 spray into the nose 2 (two) times daily., Disp: 50.7 mL, Rfl: 1  Current Facility-Administered Medications:    tezepelumab -ekko (TEZSPIRE ) 210 MG/1. syringe 210 mg, 210 mg, Subcutaneous, Q28 days, Kozlow, Camellia PARAS, MD, 210 mg at 04/20/24 0930   tezepelumab -ekko (TEZSPIRE ) 210 MG/1. syringe 210 mg, 210 mg, Subcutaneous, Q28 days, Kozlow, Camellia PARAS, MD, 210 mg at 07/13/24 9147  Observations/Objective: There were no vitals filed for this visit. Physical Exam Sherry looked well, in NAD  Assessment and Plan: Assessment & Plan Obesity (BMI 30-39.9) Sherry has A1c of 6 at office visit on 06/11/2024.  She has been able to lose 5 pounds on her own over the last 3 months with diet and exercise changes.  Does not have any contraindications to weight loss medication at this time.  After discussing options did tell Sherry that GLP-1's would not be  covered under her insurance and Qsymia may be at a reduced cost compared to GLP-1's.  Did discuss that Sherry would have to take these medications likely long-term as if she discontinued these medications she would probably gain weight again.  Sherry elected to try to continue to lose weight with lifestyle changes.  She  was agreeable to referral to dietitian but this will also be dependent on cost, and based off of insurance Sherry will likely have some out-of-pocket cost for this as well.   Orders:   Amb ref to Medical Nutrition Therapy-MNT     Follow Up Instructions: Return if symptoms worsen or fail to improve.     The Sherry was advised to call back or seek an in-person evaluation if the symptoms worsen or if the condition fails to improve as anticipated.    Sherry seen with Dr. Karna Na D'Mello, DO

## 2024-08-04 NOTE — Patient Instructions (Addendum)
 Today we discussed the following medical conditions and plan:   We discussed using medications for weight loss.  Unfortunately most of the medications that could be could for this will not be covered under insurance and would also only provide benefit while you are taking them.  After discussion I think it is encouraging to see that you have been able to lose some weight on your own with diet and exercise would advise you to continue to keep up this regimen.  We we will also refer you to our dietitian for some nutrition counseling.If you do change your mind about the medications, please schedule an appointment with us    We look forward to seeing you next time. Please call our clinic at [phone] if you have any questions or concerns. The best time to call is Monday-Friday from 9am-4pm, but there is someone available 24/7. If you need medication refills, please notify your pharmacy one week in advance and they will send us  a request.   Thank you for trusting me with your care. Wishing you the best!   Roslyn D'Mello, DO [facility]

## 2024-08-04 NOTE — Assessment & Plan Note (Addendum)
 Patient has A1c of 6 at office visit on 06/11/2024.  She has been able to lose 5 pounds on her own over the last 3 months with diet and exercise changes.  Does not have any contraindications to weight loss medication at this time.  After discussing options did tell patient that GLP-1's would not be covered under her insurance and Qsymia may be at a reduced cost compared to GLP-1's.  Did discuss that patient would have to take these medications likely long-term as if she discontinued these medications she would probably gain weight again.  Patient elected to try to continue to lose weight with lifestyle changes.  She was agreeable to referral to dietitian but this will also be dependent on cost, and based off of insurance patient will likely have some out-of-pocket cost for this as well.   Orders:   Amb ref to Medical Nutrition Therapy-MNT

## 2024-08-05 NOTE — Addendum Note (Signed)
 Addended by: KARNA FELLOWS on: 08/05/2024 09:46 AM   Modules accepted: Level of Service

## 2024-08-05 NOTE — Progress Notes (Signed)
 Internal Medicine Clinic Attending  I was physically present during the key portions of the resident provided service and participated in the medical decision making of patient's management care. I reviewed pertinent patient test results.  The assessment, diagnosis, and plan were formulated together and I agree with the documentation in the resident's note.  Dickie La, MD

## 2024-08-10 ENCOUNTER — Ambulatory Visit (INDEPENDENT_AMBULATORY_CARE_PROVIDER_SITE_OTHER)

## 2024-08-10 DIAGNOSIS — J4550 Severe persistent asthma, uncomplicated: Secondary | ICD-10-CM | POA: Diagnosis not present

## 2024-08-25 ENCOUNTER — Other Ambulatory Visit: Payer: Self-pay

## 2024-08-25 NOTE — Progress Notes (Signed)
 Specialty Pharmacy Refill Coordination Note  Sherry Edwards is a 53 y.o. female assessed today regarding refills of clinic administered specialty medication(s) Tezepelumab -ekko (TEZSPIRE )   Clinic requested Courier to Provider Office   Delivery date: 08/31/24   Verified address: [facility] - [address] [place], [place] [address]   Medication will be filled on 08/30/24.

## 2024-08-30 ENCOUNTER — Encounter: Payer: Self-pay | Admitting: Student

## 2024-08-30 ENCOUNTER — Other Ambulatory Visit: Payer: Self-pay

## 2024-09-06 ENCOUNTER — Ambulatory Visit: Admitting: Podiatry

## 2024-09-06 ENCOUNTER — Encounter: Payer: Self-pay | Admitting: Podiatry

## 2024-09-06 ENCOUNTER — Other Ambulatory Visit (HOSPITAL_COMMUNITY): Payer: Self-pay

## 2024-09-06 VITALS — Ht 60.0 in | Wt 205.0 lb

## 2024-09-06 DIAGNOSIS — M722 Plantar fascial fibromatosis: Secondary | ICD-10-CM

## 2024-09-06 MED ORDER — METHYLPREDNISOLONE 4 MG PO TBPK
ORAL_TABLET | ORAL | 0 refills | Status: AC
  Filled 2024-09-06: qty 21, 6d supply, fill #0

## 2024-09-06 MED ORDER — MELOXICAM 15 MG PO TABS
15.0000 mg | ORAL_TABLET | Freq: Every day | ORAL | 1 refills | Status: AC
  Filled 2024-09-06: qty 60, 60d supply, fill #0

## 2024-09-06 MED ORDER — BETAMETHASONE SOD PHOS & ACET 6 (3-3) MG/ML IJ SUSP
3.0000 mg | Freq: Once | INTRAMUSCULAR | Status: AC
  Administered 2024-09-06: 3 mg via INTRA_ARTICULAR

## 2024-09-06 NOTE — Progress Notes (Signed)
   Chief Complaint  Patient presents with   Foot Pain    Pt is here to f/u on bilateral foot pain states its mainly in the right foot, states the pain is the same as before, no other concerns.    Subjective: 53 y.o. female presenting today for follow-up evaluation of plantar fasciitis bilateral.  Last seen here in the office February 2025 by Dr. Loel.  Patient states that the pain has slowly returned.  She has been unable to make a follow-up appointment   Past Medical History:  Diagnosis Date   Asthma    Past Surgical History:  Procedure Laterality Date   HERNIA REPAIR     No Known Allergies   Objective: Physical Exam General: The patient is alert and oriented x3 in no acute distress.  Dermatology: Skin is warm, dry and supple bilateral lower extremities. Negative for open lesions or macerations bilateral.   Vascular: Dorsalis Pedis and Posterior Tibial pulses palpable bilateral.  Capillary fill time is immediate to all digits.  Neurological: Grossly intact via light touch  Musculoskeletal: Tenderness to palpation to the plantar aspect of the bilateral heels along the plantar fascia. All other joints range of motion within normal limits bilateral. Strength 5/5 in all groups bilateral.   Radiographic exam B/L feet 09/06/2024: Normal osseous mineralization. Joint spaces preserved. No fracture/dislocation/boney destruction. No other soft tissue abnormalities or radiopaque foreign bodies.   Assessment: 1. plantar fasciitis bilateral feet  Plan of Care:  1. Patient evaluated. Xrays reviewed.   2. Injection of 0.5cc Celestone soluspan injected into the bilateral heels.  3. Rx for Medrol  Dose Pak placed 4. Rx for Meloxicam  ordered for patient. 5.  Continue wearing good supportive tennis shoes and sneakers 6.  Recommend daily stretching exercises which were demonstrated today.  Specifically calf stretches 7. Return to clinic in 4 weeks.    *CMA at [facility].  Lynnann Thresa EMERSON Janit, DPM [facility]  Dr. Thresa EMERSON Janit, DPM    [address] [place], [place] [address]                Office [phone]  Fax [phone]

## 2024-09-07 ENCOUNTER — Ambulatory Visit

## 2024-09-07 DIAGNOSIS — J4550 Severe persistent asthma, uncomplicated: Secondary | ICD-10-CM

## 2024-09-20 ENCOUNTER — Other Ambulatory Visit: Payer: Self-pay

## 2024-09-20 NOTE — Progress Notes (Signed)
 Specialty Pharmacy Refill Coordination Note  Sherry Edwards is a 53 y.o. female assessed today regarding refills of clinic administered specialty medication(s) Tezepelumab -ekko (TEZSPIRE )   Clinic requested Courier to Provider Office   Delivery date: 09/30/24   Verified address: [facility] - [address] [place], [place] [address]   Medication will be filled on: 09/29/24

## 2024-09-29 ENCOUNTER — Other Ambulatory Visit: Payer: Self-pay

## 2024-10-05 ENCOUNTER — Ambulatory Visit (INDEPENDENT_AMBULATORY_CARE_PROVIDER_SITE_OTHER)

## 2024-10-05 DIAGNOSIS — J4550 Severe persistent asthma, uncomplicated: Secondary | ICD-10-CM

## 2024-10-06 ENCOUNTER — Ambulatory Visit: Admitting: Podiatry

## 2024-10-06 ENCOUNTER — Encounter: Payer: Self-pay | Admitting: Podiatry

## 2024-10-06 VITALS — Ht 60.0 in | Wt 205.0 lb

## 2024-10-06 DIAGNOSIS — M722 Plantar fascial fibromatosis: Secondary | ICD-10-CM | POA: Diagnosis not present

## 2024-10-06 NOTE — Progress Notes (Signed)
   Chief Complaint  Patient presents with   Plantar Fasciitis    Pt is here to f/u on bilateral foot pain due to plantar fasciitis, she states the pain is still there in both feet, still taking meloxicam  that helps some.    Subjective: 53 y.o. female presenting today for follow-up evaluation of plantar fasciitis bilateral.   Past Medical History:  Diagnosis Date   Asthma    Past Surgical History:  Procedure Laterality Date   HERNIA REPAIR     No Known Allergies   Objective: Physical Exam General: The patient is alert and oriented x3 in no acute distress.  Dermatology: Skin is warm, dry and supple bilateral lower extremities. Negative for open lesions or macerations bilateral.   Vascular: Dorsalis Pedis and Posterior Tibial pulses palpable bilateral.  Capillary fill time is immediate to all digits.  Neurological: Grossly intact via light touch  Musculoskeletal: There continues to be tenderness to palpation to the plantar aspect of the bilateral heels along the plantar fascia. All other joints range of motion within normal limits bilateral. Strength 5/5 in all groups bilateral.   Radiographic exam B/L feet 09/06/2024: Normal osseous mineralization. Joint spaces preserved. No fracture/dislocation/boney destruction. No other soft tissue abnormalities or radiopaque foreign bodies.   Assessment: 1. plantar fasciitis bilateral feet. RT > LT  Plan of Care:  -Patient evaluated.  -Injection of 0.5cc Celestone  soluspan injected into the bilateral heels.  -Continue meloxicam  15 mg daily -The patient has now had chronic plantar fasciitis and heel pain for several months despite conservative treatment and care.  I do believe it is appropriate at this time to discuss possible surgery which would include endoscopic plantar fasciotomy to release the plantar fascia.  This was discussed in detail in length with the patient.  We discussed the risk benefits advantages disadvantages the  procedure.  No guarantees were expressed or implied.  Postoperative recovery course was also explained -Return to clinic PRN  *CMA at [facility]. Lynnann Thresa EMERSON Janit, DPM [facility]  Dr. Thresa EMERSON Janit, DPM    [address] [place], [place] [address]                Office [phone]  Fax [phone]

## 2024-10-12 ENCOUNTER — Other Ambulatory Visit (HOSPITAL_COMMUNITY): Payer: Self-pay

## 2024-10-21 ENCOUNTER — Other Ambulatory Visit: Payer: Self-pay

## 2024-10-21 NOTE — Progress Notes (Signed)
 Specialty Pharmacy Refill Coordination Note  Sherry Edwards is a 53 y.o. female assessed today regarding refills of clinic administered specialty medication(s) Tezepelumab -ekko (TEZSPIRE )   Clinic requested Courier to Provider Office   Delivery date: 10/27/24   Verified address: [facility] - [address] [place], [place] [address]   Medication will be filled on: 10/26/24

## 2024-10-26 ENCOUNTER — Other Ambulatory Visit: Payer: Self-pay

## 2024-11-02 ENCOUNTER — Ambulatory Visit (INDEPENDENT_AMBULATORY_CARE_PROVIDER_SITE_OTHER)

## 2024-11-02 DIAGNOSIS — J4550 Severe persistent asthma, uncomplicated: Secondary | ICD-10-CM

## 2024-11-16 ENCOUNTER — Other Ambulatory Visit: Payer: Self-pay

## 2024-11-19 ENCOUNTER — Other Ambulatory Visit: Payer: Self-pay

## 2024-11-19 ENCOUNTER — Other Ambulatory Visit (HOSPITAL_COMMUNITY): Payer: Self-pay

## 2024-11-19 NOTE — Progress Notes (Signed)
 Patient's refill has been scheduled in [place].

## 2024-11-19 NOTE — Progress Notes (Signed)
 Specialty Pharmacy Refill Coordination Note  Anouk Bouchard is a 54 y.o. female assessed today regarding refills of clinic administered specialty medication(s) Tezepelumab -ekko (TEZSPIRE )   Clinic requested Courier to Provider Office   Delivery date: 11/25/24   Verified address: [facility] - [address] [place], [place] [address]   Medication will be filled on: 11/24/24

## 2024-11-24 ENCOUNTER — Other Ambulatory Visit: Payer: Self-pay

## 2024-11-30 ENCOUNTER — Ambulatory Visit

## 2024-12-02 ENCOUNTER — Ambulatory Visit (INDEPENDENT_AMBULATORY_CARE_PROVIDER_SITE_OTHER)

## 2024-12-02 DIAGNOSIS — J4550 Severe persistent asthma, uncomplicated: Secondary | ICD-10-CM | POA: Diagnosis not present

## 2024-12-15 ENCOUNTER — Other Ambulatory Visit: Payer: Self-pay

## 2024-12-15 NOTE — Progress Notes (Signed)
 Specialty Pharmacy Refill Coordination Note  Sherry Edwards is a 54 y.o. female assessed today regarding refills of clinic administered specialty medication(s) Tezepelumab -ekko (TEZSPIRE )   Clinic requested Courier to Provider Office   Delivery date: 12/27/24   [facility] address[facility]  - [address] [place], [place] [address]   Medication will be filled on: 12/24/24  Appointment 12/30/24.

## 2024-12-30 ENCOUNTER — Ambulatory Visit

## 2025-03-01 ENCOUNTER — Ambulatory Visit: Admitting: Allergy and Immunology
